# Patient Record
Sex: Male | Born: 1935
Health system: Southern US, Community
[De-identification: ages and names within clinical notes are randomized; demographics above are authoritative.]

## PROBLEM LIST (undated history)

## (undated) DIAGNOSIS — I4719 Other supraventricular tachycardia: Secondary | ICD-10-CM

## (undated) DIAGNOSIS — F05 Delirium due to known physiological condition: Secondary | ICD-10-CM

## (undated) DIAGNOSIS — I441 Atrioventricular block, second degree: Secondary | ICD-10-CM

## (undated) DIAGNOSIS — H3530 Unspecified macular degeneration: Secondary | ICD-10-CM

## (undated) DIAGNOSIS — R197 Diarrhea, unspecified: Secondary | ICD-10-CM

## (undated) DIAGNOSIS — K449 Diaphragmatic hernia without obstruction or gangrene: Secondary | ICD-10-CM

## (undated) DIAGNOSIS — I471 Supraventricular tachycardia: Secondary | ICD-10-CM

## (undated) DIAGNOSIS — Z950 Presence of cardiac pacemaker: Secondary | ICD-10-CM

## (undated) DIAGNOSIS — I452 Bifascicular block: Secondary | ICD-10-CM

## (undated) HISTORY — PX: COLON SURGERY: SHX602

## (undated) HISTORY — DX: Other supraventricular tachycardia: I47.19

## (undated) HISTORY — DX: Bifascicular block: I45.2

## (undated) HISTORY — DX: Atrioventricular block, second degree: I44.1

## (undated) HISTORY — DX: Presence of cardiac pacemaker: Z95.0

## (undated) HISTORY — PX: APPENDECTOMY: SHX54

## (undated) HISTORY — DX: Diaphragmatic hernia without obstruction or gangrene: K44.9

## (undated) HISTORY — DX: Diarrhea, unspecified: R19.7

## (undated) HISTORY — DX: Supraventricular tachycardia: I47.1

---

## 2002-02-08 ENCOUNTER — Other Ambulatory Visit: Admission: RE | Admit: 2002-02-08 | Discharge: 2002-02-08 | Payer: Self-pay | Admitting: Dermatology

## 2004-02-27 ENCOUNTER — Ambulatory Visit: Payer: Self-pay | Admitting: Family Medicine

## 2004-04-10 ENCOUNTER — Ambulatory Visit: Payer: Self-pay | Admitting: Family Medicine

## 2004-06-25 ENCOUNTER — Ambulatory Visit: Payer: Self-pay | Admitting: Family Medicine

## 2004-09-25 ENCOUNTER — Ambulatory Visit: Payer: Self-pay | Admitting: Family Medicine

## 2004-11-06 ENCOUNTER — Ambulatory Visit: Payer: Self-pay | Admitting: Family Medicine

## 2004-12-10 ENCOUNTER — Ambulatory Visit: Payer: Self-pay | Admitting: Family Medicine

## 2004-12-24 ENCOUNTER — Ambulatory Visit: Payer: Self-pay | Admitting: Family Medicine

## 2005-01-31 ENCOUNTER — Ambulatory Visit: Payer: Self-pay | Admitting: Family Medicine

## 2005-02-25 ENCOUNTER — Ambulatory Visit: Payer: Self-pay | Admitting: Family Medicine

## 2006-07-25 ENCOUNTER — Ambulatory Visit: Payer: Self-pay | Admitting: Family Medicine

## 2008-11-07 ENCOUNTER — Inpatient Hospital Stay (HOSPITAL_COMMUNITY): Admission: EM | Admit: 2008-11-07 | Discharge: 2008-11-08 | Payer: Self-pay | Admitting: Emergency Medicine

## 2008-11-07 ENCOUNTER — Ambulatory Visit: Payer: Self-pay | Admitting: Internal Medicine

## 2008-11-07 HISTORY — PX: PACEMAKER INSERTION: SHX728

## 2008-11-08 ENCOUNTER — Encounter: Payer: Self-pay | Admitting: Internal Medicine

## 2008-11-08 ENCOUNTER — Encounter (INDEPENDENT_AMBULATORY_CARE_PROVIDER_SITE_OTHER): Payer: Self-pay | Admitting: Cardiology

## 2008-11-10 ENCOUNTER — Encounter: Payer: Self-pay | Admitting: Internal Medicine

## 2008-11-23 ENCOUNTER — Ambulatory Visit: Payer: Self-pay

## 2008-11-23 ENCOUNTER — Encounter: Payer: Self-pay | Admitting: Internal Medicine

## 2009-02-16 DIAGNOSIS — H409 Unspecified glaucoma: Secondary | ICD-10-CM | POA: Insufficient documentation

## 2009-02-16 DIAGNOSIS — I4510 Unspecified right bundle-branch block: Secondary | ICD-10-CM

## 2009-02-17 ENCOUNTER — Ambulatory Visit: Payer: Self-pay | Admitting: Internal Medicine

## 2009-02-17 DIAGNOSIS — I4891 Unspecified atrial fibrillation: Secondary | ICD-10-CM

## 2009-02-17 DIAGNOSIS — I442 Atrioventricular block, complete: Secondary | ICD-10-CM

## 2009-09-12 ENCOUNTER — Encounter: Payer: Self-pay | Admitting: Internal Medicine

## 2009-09-13 ENCOUNTER — Ambulatory Visit: Payer: Self-pay | Admitting: Internal Medicine

## 2009-09-13 DIAGNOSIS — R0602 Shortness of breath: Secondary | ICD-10-CM

## 2009-10-03 ENCOUNTER — Telehealth (INDEPENDENT_AMBULATORY_CARE_PROVIDER_SITE_OTHER): Payer: Self-pay | Admitting: *Deleted

## 2009-10-04 ENCOUNTER — Telehealth (INDEPENDENT_AMBULATORY_CARE_PROVIDER_SITE_OTHER): Payer: Self-pay | Admitting: Radiology

## 2010-01-12 ENCOUNTER — Encounter (INDEPENDENT_AMBULATORY_CARE_PROVIDER_SITE_OTHER): Payer: Self-pay | Admitting: *Deleted

## 2010-02-02 ENCOUNTER — Ambulatory Visit: Payer: Self-pay | Admitting: Internal Medicine

## 2010-05-03 ENCOUNTER — Ambulatory Visit
Admission: RE | Admit: 2010-05-03 | Discharge: 2010-05-03 | Payer: Self-pay | Source: Home / Self Care | Attending: Internal Medicine | Admitting: Internal Medicine

## 2010-05-03 ENCOUNTER — Encounter: Payer: Self-pay | Admitting: Internal Medicine

## 2010-05-15 NOTE — Letter (Signed)
Summary: Device-Delinquent Check  [facility], Main Office  [address]   [place], [place] [address]   Phone: [phone]  Fax: [phone]     January 12, 2010 MRN: [id number]   [facility] [address] [place], [place]  [address]   Dear Mr. Haegele,  According to our records, you have not had your implanted device checked in the recommended period of time.  We are unable to determine appropriate device function without checking your device on a regular basis.  Please call our office to schedule an appointment , with Dr Johney Frame as soon as possible.  If you are having your device checked by another physician, please call us so that we may update our records.  Thank you,   [facility]

## 2010-05-15 NOTE — Progress Notes (Signed)
Summary: canclled exams  Phone Note Call from Patient   Caller: Daughter Summary of Call: Pt. decided he doesn't want any tests at this time. He has cancelled his echo and nuclear stress test. Initial call taken by: S.Akeen Ledyard

## 2010-05-15 NOTE — Assessment & Plan Note (Signed)
Summary: ROV/SOB/JSS   Visit Type:  Pacemaker check Primary Provider:  Dr. Lysbeth Galas  CC:  sob...edema/ankles...daughter said pt had 1 episode last week when pt was working in his garden had a fast heart rate...  History of Present Illness: Mr Pitta presents today for follow-up.  He reports progressive dypnsea with moderate activity recently.  He denies orthopnea, PND, or chest pain.  He reports recent BLE edema.  He was recently evaluated in Dr Joyce Copa office and initiated on lasix.  He is otherwise without complaint today.  Current Medications (verified): 1)  Multivitamins   Tabs (Multiple Vitamin) .... Once Daily 2)  Travatan Z 0.004 % Soln (Travoprost) .... Once Daily Left Eye 3)  Lasix 20 Mg Tabs (Furosemide) .Marland Kitchen.. 1 Tab Once Daily  Allergies (verified): No Known Drug Allergies  Past History:  Past Medical History:  1. Tachy palpitations for which has been treated with beta-blockers.   2. Hiatal hernia.   3. 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   4. Glaucoma.   5. Pacemaker implant for Mobitz II AV block  Past Surgical History: Reviewed history from 02/17/2009 and no changes required. appendectomy  pacemaker implantation 7/10  Social History: Reviewed history from 02/16/2009 and no changes required.  The patient lives in [place] alone.  He is widowed and   has 2 children.  He has a remote history of tobacco and denies alcohol   or drug use.   Review of Systems       All systems are reviewed and negative except as listed in the HPI.   Vital Signs:  Patient profile:   77 year old male Height:      69 inches Weight:      180 pounds BMI:     26.68 Pulse rate:   76 / minute Pulse rhythm:   irregular BP sitting:   114 / 70  (left arm) Cuff size:   large  Vitals Entered By: Danielle Rankin, CMA (September 13, 2009 4:30 PM)  Physical Exam  General:  elderly, looks much younger than stated age Head:  normocephalic and atraumatic Eyes:  PERRLA/EOM  intact; conjunctiva and lids normal. Mouth:  Teeth, gums and palate normal. Oral mucosa normal. Neck:  supple, JVP 8 Lungs:  few bibasilar rales, otherwise clear Heart:  RRR, no m/r/g Abdomen:  Bowel sounds positive; abdomen soft and non-tender without masses, organomegaly, or hernias noted. No hepatosplenomegaly. Msk:  Back normal, normal gait. Muscle strength and tone normal. Pulses:  pulses normal in all 4 extremities Extremities:  No clubbing or cyanosis. Neurologic:  Alert and oriented x 3. Skin:  Intact without lesions or rashes. Psych:  Normal affect.   EKG  Procedure date:  09/13/2009  Findings:      sinus 76 bpm, LBBB  PPM Specifications Following MD:  Hillis Range, MD     PPM Vendor:  St Jude     PPM Model Number:  [id number]     PPM Serial Number:  [id number] PPM DOI:  11/07/2008     PPM Implanting MD:  Hillis Range, MD  Lead 1    Location: RA     DOI: 11/07/2008     Model #: 1688TC     Serial #: [id number]     Status: active Lead 2    Location: RV     DOI: 11/07/2008     Model #: 1948     Serial #: [id number]     Status: active  Magnet Response Rate:  BOL 100 ERI 85  Indications:  Sick sinus syndrome   PPM Follow Up Pacer Dependent:  No      Episodes Coumadin:  No  Parameters Mode:  DDD     Lower Rate Limit:  60     Upper Rate Limit:  110 Paced AV Delay:  180     Sensed AV Delay:  160 MD Comments:  agree  Impression & Recommendations:  Problem # 1:  MOBITZ II ATRIOVENTRICULAR BLOCK (ICD-426.12) normal pacemaker function maximum heart rate with pacing increased to 130 bpm to reduce dyspnea  Problem # 2:  SHORTNESS OF BREATH (ICD-786.05) unclear etiology very mild volume overload on exam we will obtain an echo and lexiscan myoview to evaluate cause of dypnsea if low to moderate risk myoview, would treat medically given advanced age  Other Orders: Echocardiogram (Echo) Nuclear Stress Test (Nuc Stress Test)  Patient Instructions: 1)  Your physician  recommends that you schedule a follow-up appointment in: 4 weeks with Dr Johney Frame 2)  Your physician has requested that you have an echocardiogram.  Echocardiography is a painless test that uses sound waves to create images of your heart. It provides your doctor with information about the size and shape of your heart and how well your heart's chambers and valves are working.  This procedure takes approximately one hour. There are no restrictions for this procedure. 3)  Your physician has requested that you have an lexiscan myoview.  For further information please visit [email or web address]  Please follow instruction sheet, as given.

## 2010-05-15 NOTE — Progress Notes (Signed)
Summary: Nuclear Pre-Procedure  Phone Note Outgoing Call   Call placed by: Milana Na, EMT-P,  October 03, 2009 3:11 PM Summary of Call: Reviewed information on Myoview Information Sheet (see scanned document for further details).  Spoke with patient's daughter.     Nuclear Med Background Indications for Stress Test: Evaluation for Ischemia   History: Echo, Myocardial Perfusion Study, Pacemaker   Symptoms: DOE    Nuclear Pre-Procedure Cardiac Risk Factors: History of Smoking, LBBB Height (in): 69  Nuclear Med Study Referring MD:  J.Allred     Appended Document: Nuclear Pre-Procedure Missing information for the phone note. '05 MPS (+) Recommended a Cath but the patient declined. 07/10 ECHO EF 55-65% 07/10 PACEMAKER due to Symptomatic Bradycardia

## 2010-05-15 NOTE — Assessment & Plan Note (Signed)
Summary: pc2/st jude/lg   Visit Type:  Follow-up Primary Provider:  Dr. Lysbeth Galas   History of Present Illness: Austin Pittman presents today for follow-up.  He reports stable dypnsea with moderate activity recently.  He denies orthopnea, PND, or chest pain.  He reports recent BLE edema.  He was recently evaluated in Dr Joyce Copa office and initiated on lasix.  He has declined previously suggested echo and nuclear testing.  Current Medications (verified): 1)  Multivitamins   Tabs (Multiple Vitamin) .... Once Daily 2)  Travatan Z 0.004 % Soln (Travoprost) .... Once Daily Left Eye  Allergies (verified): No Known Drug Allergies  Past History:  Past Medical History:  1. Nonsustained atrial tachycardia  2. Hiatal hernia.   3. 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   4. Glaucoma.   5. Pacemaker implant for Mobitz II AV block  Past Surgical History: Reviewed history from 02/17/2009 and no changes required. appendectomy  pacemaker implantation 7/10  Social History: Reviewed history from 02/16/2009 and no changes required.  The patient lives in [place] alone.  He is widowed and   has 2 children.  He has a remote history of tobacco and denies alcohol   or drug use.   Review of Systems       All systems are reviewed and negative except as listed in the HPI.   Vital Signs:  Patient profile:   77 year old male Height:      69 inches Weight:      180 pounds BMI:     26.68 Pulse rate:   70 / minute BP sitting:   116 / 60  (left arm)  Vitals Entered By: Laurance Flatten CMA (February 02, 2010 9:12 AM)  Physical Exam  General:  elderly, looks much younger than stated age Head:  normocephalic and atraumatic Eyes:  PERRLA/EOM intact; conjunctiva and lids normal. Mouth:  Teeth, gums and palate normal. Oral mucosa normal. Neck:  supple, JVP 8 Chest Wall:  pacemaker site well healed Lungs:  CTAB Heart:  RRR, no m/r/g Abdomen:  Bowel sounds positive; abdomen soft and  non-tender without masses, organomegaly, or hernias noted. No hepatosplenomegaly. Msk:  Back normal, normal gait. Muscle strength and tone normal. Pulses:  pulses normal in all 4 extremities Extremities:  No clubbing or cyanosis. Neurologic:  Alert and oriented x 3.   PPM Specifications Following MD:  Hillis Range, MD     PPM Vendor:  St Jude     PPM Model Number:  [id number]     PPM Serial Number:  [id number] PPM DOI:  11/07/2008     PPM Implanting MD:  Hillis Range, MD  Lead 1    Location: RA     DOI: 11/07/2008     Model #: 1688TC     Serial #: [id number]     Status: active Lead 2    Location: RV     DOI: 11/07/2008     Model #: 1948     Serial #: [id number]     Status: active  Magnet Response Rate:  BOL 100 ERI 85  Indications:  Sick sinus syndrome   PPM Follow Up Remote Check?  No Battery Voltage:  2.96 V     Battery Est. Longevity:  8.8 years     Pacer Dependent:  No       PPM Device Measurements Atrium  Amplitude: 3.3 mV, Impedance: 400 ohms, Threshold: 0.875 V at 0.4 msec Right Ventricle  Amplitude: 10.6 mV, Impedance: 710 ohms,  Threshold: 0.5 V at 0.4 msec  Episodes MS Episodes:  26     Percent Mode Switch:  <1%     Coumadin:  No  Parameters Mode:  DDD     Lower Rate Limit:  60     Upper Rate Limit:  110 Paced AV Delay:  180     Sensed AV Delay:  160 Next Remote Date:  05/03/2010     Next Cardiology Appt Due:  01/14/2011 Tech Comments:  No parameter changes.  Device function normal.  Longest mode switch 24 seconds, - coumadin.  Merlin transmissions every 3 months.  ROV 1 year with Dr. Johney Frame. Altha Harm, LPN  February 02, 2010 9:20 AM  MD Comments:  agree  Impression & Recommendations:  Problem # 1:  MOBITZ II ATRIOVENTRICULAR BLOCK (ICD-426.12) normal pacemaker function no changes  Problem # 2:  SHORTNESS OF BREATH (ICD-786.05) stable salt restriction  The following medications were removed from the medication list:    Lasix 20 Mg Tabs (Furosemide) .Marland Kitchen... 1 tab once  daily  Patient Instructions: 1)  Your physician recommends that you schedule a follow-up appointment in: 12 months. 2)  Your physician recommends that you continue on your current medications as directed. Please refer to the Current Medication list given to you today.

## 2010-05-27 ENCOUNTER — Encounter (INDEPENDENT_AMBULATORY_CARE_PROVIDER_SITE_OTHER): Payer: Self-pay | Admitting: *Deleted

## 2010-06-06 NOTE — Letter (Signed)
Summary: Remote Device Check  [facility], Main Office  [address]   [place], [place] [address]   Phone: [phone]  Fax: [phone]     May 27, 2010 MRN: [id number]   [facility] [address] [place], [place]  [address]   Dear Mr. Maultsby,   Your remote transmission was recieved and reviewed by your physician.  All diagnostics were within normal limits for you.  __X___Your next transmission is scheduled for: 08-02-2010.  Please transmit at any time this day.  If you have a wireless device your transmission will be sent automatically.   Sincerely,  Vella Kohler

## 2010-06-06 NOTE — Cardiovascular Report (Signed)
Summary: Office Visit Remote   Office Visit Remote   Imported By: Roderic Ovens 05/29/2010 14:48:43  _____________________________________________________________________  External Attachment:    Type:   Image     Comment:   External Document

## 2010-07-22 LAB — POCT I-STAT, CHEM 8
BUN: 31 mg/dL — ABNORMAL HIGH (ref 6–23)
Calcium, Ion: 1.15 mmol/L (ref 1.12–1.32)
HCT: 41 % (ref 39.0–52.0)
Hemoglobin: 13.9 g/dL (ref 13.0–17.0)
Sodium: 140 mEq/L (ref 135–145)
TCO2: 24 mmol/L (ref 0–100)

## 2010-07-22 LAB — COMPREHENSIVE METABOLIC PANEL
ALT: 16 U/L (ref 0–53)
Albumin: 3.5 g/dL (ref 3.5–5.2)
Alkaline Phosphatase: 48 U/L (ref 39–117)
Calcium: 8.7 mg/dL (ref 8.4–10.5)
Potassium: 3.8 mEq/L (ref 3.5–5.1)
Sodium: 141 mEq/L (ref 135–145)
Total Protein: 6.3 g/dL (ref 6.0–8.3)

## 2010-07-22 LAB — POCT CARDIAC MARKERS
Myoglobin, poc: 148 ng/mL (ref 12–200)
Troponin i, poc: <0.05 ng/mL (ref 0.00–0.09)

## 2010-07-22 LAB — PROTIME-INR
INR: 1 (ref 0.00–1.49)
Prothrombin Time: 13.3 seconds (ref 11.6–15.2)

## 2010-07-22 LAB — CBC
Hemoglobin: 14.1 g/dL (ref 13.0–17.0)
RBC: 4.35 MIL/uL (ref 4.22–5.81)
WBC: 7.9 10*3/uL (ref 4.0–10.5)

## 2010-07-22 LAB — TSH: TSH: 3.832 u[IU]/mL (ref 0.350–4.500)

## 2010-07-22 LAB — DIFFERENTIAL
Basophils Relative: 0 % (ref 0–1)
Eosinophils Absolute: 0.1 10*3/uL (ref 0.0–0.7)
Lymphs Abs: 1 10*3/uL (ref 0.7–4.0)
Monocytes Absolute: 0.5 10*3/uL (ref 0.1–1.0)
Monocytes Relative: 7 % (ref 3–12)
Neutro Abs: 6.2 10*3/uL (ref 1.7–7.7)

## 2010-07-22 LAB — CK TOTAL AND CKMB (NOT AT ARMC)
CK, MB: 2.3 ng/mL (ref 0.3–4.0)
Relative Index: 2.3 (ref 0.0–2.5)
Total CK: 100 U/L (ref 7–232)

## 2010-07-22 LAB — TROPONIN I: Troponin I: 0.01 ng/mL (ref 0.00–0.06)

## 2010-08-02 ENCOUNTER — Ambulatory Visit (INDEPENDENT_AMBULATORY_CARE_PROVIDER_SITE_OTHER): Payer: No Typology Code available for payment source | Admitting: *Deleted

## 2010-08-02 DIAGNOSIS — I4891 Unspecified atrial fibrillation: Secondary | ICD-10-CM

## 2010-08-02 DIAGNOSIS — I441 Atrioventricular block, second degree: Secondary | ICD-10-CM

## 2010-08-03 ENCOUNTER — Other Ambulatory Visit: Payer: Self-pay | Admitting: Internal Medicine

## 2010-08-12 ENCOUNTER — Encounter: Payer: Self-pay | Admitting: *Deleted

## 2010-08-12 NOTE — Progress Notes (Signed)
Pacer remote check  

## 2010-08-28 NOTE — Discharge Summary (Signed)
Austin Pittman, INGRAHAM NO.:  [id number]   MEDICAL RECORD NO.:  [id number]          PATIENT TYPE:  INP   LOCATION:  2501                         FACILITY:  [facility]   PHYSICIAN:  Nicki Guadalajara, M.D.     DATE OF BIRTH:  July 16, 1915   DATE OF ADMISSION:  11/07/2008  DATE OF DISCHARGE:  11/08/2008                               DISCHARGE SUMMARY   DISCHARGE DIAGNOSES:  1. Symptomatic bradycardia, St. Jude pacemaker implant this admission      by Dr. Johney Frame.  2. Presumed coronary disease with remote abnormal stress test, the      patient declined catheterization, and he has been treated medically      since.  3. 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   HOSPITAL COURSE:  The patient is a 77 year old male, followed by Dr.  Tresa Endo and Dr. Lysbeth Galas.  He has been on Bystolic 2.5 mg a day as an  outpatient.  He had a remote stress test in 2005 that was abnormal and  cath was recommended, but the patient declined.  The patient presented  with weakness on November 07, 2008, and was found to have symptomatic  bradycardia with intermittent third-degree AV block.  The patient was  seen in consult by Dr. Johney Frame.  He underwent elective pacemaker implant  without complications on November 07, 2008.  Postoperatively, he is doing  well and we feel he could be discharged.  He will follow up with Dr.  Johney Frame for wound check as directed and then see Dr. Tresa Endo in [place].   DISCHARGE MEDICATION:  Ophthalmic drops for glaucoma.   LABORATORY DATA:  Troponins were negative x2.  INR 1.0.  TSH 3.82.  CK-  MB and troponin are negative x1.  Sodium 141, potassium 3.8, BUN 28,  creatinine 1.3.  Liver functions were normal.  White count 7.9,  hemoglobin 14.1, hematocrit 41, platelets 103.   Chest x-ray shows pacemaker placement with no pneumothorax.   DISPOSITION:  The patient is discharged in stable condition with  followup as noted above.  The patient is instructed to keep the site  dry  for 7 days.      Abelino Derrick, P.A.    ______________________________  Nicki Guadalajara, M.D.    Lenard Lance  D:  11/08/2008  T:  11/08/2008  Job:  [id number]   cc:   Delaney Meigs, M.D.  Hillis Range, MD

## 2010-08-28 NOTE — Consult Note (Signed)
Austin Pittman, Austin Pittman NO.:  [id number]   MEDICAL RECORD NO.:  [id number]          PATIENT TYPE:  INP   LOCATION:  2501                         FACILITY:  [facility]   PHYSICIAN:  Hillis Range, MD       DATE OF BIRTH:  03/05/1916   DATE OF CONSULTATION:  11/07/2008  DATE OF DISCHARGE:                                 CONSULTATION   REQUESTING PHYSICIAN:  Sheliah Mends, MD   PRIMARY CARE PHYSICIAN:  Delaney Meigs, MD   REASON FOR CONSULTATION:  Tachycardia-bradycardia syndrome with  symptoms.   HISTORY OF PRESENT ILLNESS:  Austin Pittman is a very pleasant 77 year old  gentleman without significant past medical problems except for  palpitations for which, he has been treated previously with beta-  blockers, who presents with symptoms of dizziness and fatigue.  The  patient reports being in his usual state of health though he has had  some progressive symptoms of shortness of breath over the past month.  Nevertheless, he remains very active and continues to do his own  gardening.  He notes that over the past 2 weeks, he has had progressive  symptoms of fatigue and decreased exercise tolerance.  He reports  multiple episodes of dizziness with presyncope.  He denies Austin  syncope.  He subsequently was checking his heart rate at home and  documented it to be in the 30s.  He therefore presented to the emergency  department where he was found to have Mobitz II second-degree AV block  with prolonged RR intervals and occasional complete heart block.  He  previously has a longstanding history of a right bundle branch block in  left anterior fascicular block pattern.  He has also had multiple  episodes of palpitations for which, he was initially placed on atenolol.  Due to bradycardias, atenolol was discontinued, and he was tried on  Bystolic at a minimal dose.  He continues to have occasional  palpitations with this.  He reports intermittent compliance with  Bystolic recently.   He now presents for further evaluation and  management of his bradyarrhythmias.  He is otherwise without complaint  today.  He denies chest pain, orthopnea, PND, or syncope.  He has not  had a recent fevers, chills, or symptoms of infection.   PAST MEDICAL HISTORY:  1. Tachy palpitations for which has been treated with beta-blockers.  2. Hiatal hernia.  3. 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  4. Glaucoma.   MEDICATIONS:  Bystolic 2.5 mg daily.   ALLERGIES:  No known drug allergies.   SOCIAL HISTORY:  The patient lives in [place] alone.  He is widowed and  has 2 children.  He has a remote history of tobacco and denies alcohol  or drug use.   FAMILY HISTORY:  He is unaware of any significant arrhythmias.   REVIEW OF SYSTEMS:  All systems were reviewed and negative except as  outlined in the HPI above.   PHYSICAL EXAMINATION:  VITAL SIGNS:  Blood pressure 139/63, heart rate  56, respirations 16, sats 98% on room air,  afebrile.  GENERAL:  The  patient is a well-appearing elderly gentleman, who appears much younger  than his stated age.  He is alert and oriented x3.  HEENT:  Normocephalic, atraumatic.  Sclerae clear.  Conjunctivae pink.  Oropharynx clear.  NECK:  Supple.  No thyromegaly, JVD, or bruits.  LUNGS:  Clear to auscultation bilaterally.  HEART:  Regular rate and rhythm, bradycardic rhythm.  No murmurs, rubs,  or gallops.  GI:  Soft, nontender, nondistended.  Positive bowel sounds.  EXTREMITIES:  No clubbing, cyanosis, or edema.  NEUROLOGIC:  Strength and sensation are intact.  SKIN:  No ecchymoses or lacerations.  MUSCULOSKELETAL:  No deformity or atrophy.  PSYCHIATRIC:  Euthymic mood.  Full affect.   EKG reveals sinus bradycardia with a first-degree AV block and a PR  duration of 265 milliseconds.  There is a right bundle branch, left  anterior fascicular block pattern.  There is a second EKG from today  also, which reveals Mobitz II  second-degree AV block with 2:1 AV block.  Additional telemetry strips reveal intermittent complete heart block.   LABORATORY DATA:  Sodium 141, potassium 3.8, BUN 28, creatinine 1.35.  Cardiac markers negative thus far.  White blood cell count 7.9,  hematocrit 41, platelets 103.   Chest x-ray:  I reviewed the patient's chest x-ray today, which reveals  no acute airspace disease.   IMPRESSION:  Austin Pittman is a very pleasant 77 year old gentleman with a  longstanding history of trifascicular block, who is now admitted with  symptomatic Mobitz II second-degree atrioventricular block and  intermittent third-degree atrioventricular block.  He has had symptoms  of palpitations for which, he requires beta-blockers long term.  He  therefore has tachycardia-bradycardia syndrome.  There is no clearly  reversible cause for the patient's atrioventricular block.  I suspect  that it is likely related to his advanced age.  I therefore would  recommend pacemaker implantation at this time.   PLAN:  Risks, benefits, and alternatives to dual-chamber pacemaker  implantation were discussed at length with the patient today.  He  understands that the risk include but are not limited to infection,  bleeding, pneumothorax, vascular damage, pericardial effusion,  tamponade, perforation, lead dislodgement requiring revision, renal  failure, MI, stroke, and death.  He accepts these risks and wishes to  proceed.  We will therefore plan for pacemaker implantation at the next  available time.      Hillis Range, MD  Electronically Signed     JA/MEDQ  D:  11/07/2008  T:  11/08/2008  Job:  [id number]   cc:   Sheliah Mends, MD  Delaney Meigs, M.D.  Nicki Guadalajara, M.D.

## 2010-08-28 NOTE — Op Note (Signed)
NAMEKAHIL, AGNER NO.:  [id number]   MEDICAL RECORD NO.:  [id number]          PATIENT TYPE:  INP   LOCATION:  2501                         FACILITY:  [facility]   PHYSICIAN:  Hillis Range, MD       DATE OF BIRTH:  December 22, 1915   DATE OF PROCEDURE:  DATE OF DISCHARGE:                               OPERATIVE REPORT   SURGEON:  Hillis Range, MD   PREPROCEDURE DIAGNOSES:  1. Mobitz II second-degree atrioventricular block.  2. Tachycardia-bradycardia syndrome.   POSTPROCEDURE DIAGNOSES:  1. Mobitz II second-degree atrioventricular block.  2. Tachycardia-bradycardia syndrome.   PROCEDURE:  Dual-chamber pacemaker implantation.   INTRODUCTION:  Mr. Harshberger is a very pleasant 77 year old gentleman without  significant cardiac history who presents with Mobitz II second-degree AV  block as well as intermittent complete heart block.  He has a  longstanding right bundle-branch block and left anterior fascicular  block.  He reports symptoms of fatigue, decreased exercise tolerance,  and dizziness over the past few days.  Upon presentation to [facility]  Emergency Department, he was found to have Mobitz II second-degree AV  block.  He has previously had palpitations which required chronic  therapy with beta-blockers.  His beta-blockers have been minimized;  however, he continues to have these symptoms.  He therefore presents for  dual-chamber pacemaker implantation.   DESCRIPTION OF PROCEDURE:  Informed written consent was obtained and the  patient was brought to the electrophysiology lab in a fasting state.  He  was adequately sedated with intravenous Valium as outlined in the  nursing report.  The patient's left chest was prepped and draped in the  usual sterile fashion by the EP lab staff.  The skin overlying the left  deltopectoral region was infiltrated with lidocaine for local analgesia.  A 5-cm incision was made over the left deltopectoral region.  A  subcutaneous  pacemaker pocket was fashioned using a combination of sharp  and blunt dissection.  Electrocautery was used to assure hemostasis.  The left cephalic vein was visualized and cannulated.  Through this  vein, a St. Jude Medical Tendril SDX model [id number] (serial number [id number]) right atrial lead and a St. Jude Medical Isoflex model [phone]  (serial number [id number]) right ventricular lead were advanced with  fluoroscopic visualization into the right atrial appendage and right  ventricular apex positions respectively.  Initial lead measurements  revealed an atrial lead P-wave of 2.millivolts with an impedance of 393  ohms and a threshold of 1.6 volts at 0.5 milliseconds.  The right  ventricular lead R-wave measured 10.3 millivolts with an impedance of  708 ohms and a threshold of 0.4 volts at 0.5 milliseconds.  The leads  were then secured to the pectoralis fascia using #2 silk suture over the  suture sleeves.  The leads were then connected to a St. Jude Medical  Accent RFDR model [phone] (serial number [id number]) dual-chamber pacemaker.  The pacemaker pocket was irrigated with copious gentamicin solution.  The pacemaker was then placed into the pocket.  The pocket was then  closed  in 2 layers with 2.0 Vicryl suture for the subcutaneous and  subcuticular layers.  Steri-Strips and a sterile dressing were then  applied.  There were no early apparent complications.   CONCLUSIONS:  1. Mobitz II second-degree AV block with symptomatic bradycardia upon      presentation.  2. Successful dual-chamber pacemaker implantation as described above.  3. No early apparent complications.      Hillis Range, MD  Electronically Signed     JA/MEDQ  D:  11/07/2008  T:  11/08/2008  Job:  [id number]   cc:   Nicki Guadalajara, M.D.  Delaney Meigs, M.D.  Sheliah Mends, MD

## 2010-11-01 ENCOUNTER — Ambulatory Visit (INDEPENDENT_AMBULATORY_CARE_PROVIDER_SITE_OTHER): Payer: Medicare Other | Admitting: *Deleted

## 2010-11-01 ENCOUNTER — Other Ambulatory Visit: Payer: Self-pay

## 2010-11-01 DIAGNOSIS — I495 Sick sinus syndrome: Secondary | ICD-10-CM

## 2010-11-01 DIAGNOSIS — I441 Atrioventricular block, second degree: Secondary | ICD-10-CM

## 2010-11-01 DIAGNOSIS — I4891 Unspecified atrial fibrillation: Secondary | ICD-10-CM

## 2010-11-01 LAB — REMOTE PACEMAKER DEVICE
AL THRESHOLD: 0.875 V
BAMS-0003: 70 {beats}/min
DEVICE MODEL PM: 2328745

## 2010-11-07 NOTE — Progress Notes (Signed)
Pacer remote check  

## 2010-11-09 ENCOUNTER — Encounter: Payer: Self-pay | Admitting: *Deleted

## 2011-01-22 ENCOUNTER — Encounter: Payer: Self-pay | Admitting: *Deleted

## 2011-01-22 ENCOUNTER — Encounter: Payer: Self-pay | Admitting: Internal Medicine

## 2011-02-08 ENCOUNTER — Encounter: Payer: Self-pay | Admitting: Internal Medicine

## 2011-02-08 ENCOUNTER — Ambulatory Visit (INDEPENDENT_AMBULATORY_CARE_PROVIDER_SITE_OTHER): Payer: Medicare Other | Admitting: Internal Medicine

## 2011-02-08 DIAGNOSIS — I441 Atrioventricular block, second degree: Secondary | ICD-10-CM

## 2011-02-08 DIAGNOSIS — I4891 Unspecified atrial fibrillation: Secondary | ICD-10-CM

## 2011-02-08 LAB — PACEMAKER DEVICE OBSERVATION
AL IMPEDENCE PM: 362.5 Ohm
AL THRESHOLD: 0.875 V
ATRIAL PACING PM: 22
BAMS-0003: 70 {beats}/min
DEVICE MODEL PM: 2328745
RV LEAD AMPLITUDE: 9.4 mv
RV LEAD THRESHOLD: 0.5 V

## 2011-02-08 NOTE — Assessment & Plan Note (Signed)
Normal pacemaker function See Pace Art report No changes today  

## 2011-02-08 NOTE — Progress Notes (Signed)
The patient presents today for routine electrophysiology followup.  Since last being seen in our clinic, the patient reports doing very well.  Today, he denies symptoms of palpitations, chest pain,  orthopnea, PND, lower extremity edema, dizziness, presyncope, syncope, or neurologic sequela.  His dypsnea is stable.  The patient feels that he is tolerating medications without difficulties and is otherwise without complaint today.   Past Medical History  Diagnosis Date  . Atrial tachycardia     nonsustained  . Hiatal hernia   . 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 Glaucoma   . Second degree Mobitz II AV block     s/p PPM    Past Surgical History  Procedure Date  . Appendectomy   . Pacemaker insertion 11/07/08    St Jude implanted by Dr Johney Frame    Current Outpatient Prescriptions  Medication Sig Dispense Refill  . beta carotene w/minerals (OCUVITE) tablet Take 1 tablet by mouth daily.        . travoprost, benzalkonium, (TRAVATAN) 0.004 % ophthalmic solution Place 1 drop into both eyes at bedtime.         No Known Allergies  History   Social History  . Marital Status: Widowed    Spouse Name: N/A    Number of Children: 2  . Years of Education: N/A   Occupational History  . Not on file.   Social History Main Topics  . Smoking status: Former Smoker -- 1.0 packs/day for 20 years    Types: Cigarettes    Quit date: 04/15/1946  . Smokeless tobacco: Former Neurosurgeon    Types: Chew    Quit date: 04/15/1948   Comment: chewed tobacco for 2 years  . Alcohol Use: No  . Drug Use: No  . Sexually Active: Not on file   Other Topics Concern  . Not on file   Social History Narrative  . No narrative on file    Physical Exam: Filed Vitals:   02/08/11 1341  BP: 123/74  Pulse: 69  Height: 5\' 9"  (1.753 m)  Weight: 174 lb (78.926 kg)    GEN- The patient is well appearing, alert and oriented x 3 today.   Head- normocephalic, atraumatic Eyes-  Sclera  clear, conjunctiva pink Ears- hearing intact Oropharynx- clear Neck- supple, no JVP Lymph- no cervical lymphadenopathy Lungs- Clear to ausculation bilaterally, normal work of breathing Chest- pacemaker pocket is well healed Heart- Regular rate and rhythm, no murmurs, rubs or gallops, PMI not laterally displaced GI- soft, NT, ND, + BS Extremities- no clubbing, cyanosis, or edema  Pacemaker interrogation- reviewed in detail today,  See PACEART report  Assessment and Plan:

## 2011-05-09 ENCOUNTER — Encounter: Payer: Medicare Other | Admitting: *Deleted

## 2011-05-13 ENCOUNTER — Encounter: Payer: Self-pay | Admitting: *Deleted

## 2011-09-08 ENCOUNTER — Inpatient Hospital Stay (HOSPITAL_BASED_OUTPATIENT_CLINIC_OR_DEPARTMENT_OTHER)
Admission: EM | Admit: 2011-09-08 | Discharge: 2011-09-14 | DRG: 389 | Disposition: A | Discharge status: Home / Self Care | Payer: Medicare Other | Attending: General Surgery | Admitting: General Surgery

## 2011-09-08 ENCOUNTER — Emergency Department (HOSPITAL_BASED_OUTPATIENT_CLINIC_OR_DEPARTMENT_OTHER): Payer: Medicare Other

## 2011-09-08 ENCOUNTER — Encounter (HOSPITAL_BASED_OUTPATIENT_CLINIC_OR_DEPARTMENT_OTHER): Payer: Self-pay | Admitting: *Deleted

## 2011-09-08 DIAGNOSIS — D72829 Elevated white blood cell count, unspecified: Secondary | ICD-10-CM

## 2011-09-08 DIAGNOSIS — H3530 Unspecified macular degeneration: Secondary | ICD-10-CM | POA: Diagnosis present

## 2011-09-08 DIAGNOSIS — K5669 Other intestinal obstruction: Principal | ICD-10-CM | POA: Diagnosis present

## 2011-09-08 DIAGNOSIS — K4090 Unilateral inguinal hernia, without obstruction or gangrene, not specified as recurrent: Secondary | ICD-10-CM | POA: Diagnosis present

## 2011-09-08 DIAGNOSIS — Z950 Presence of cardiac pacemaker: Secondary | ICD-10-CM

## 2011-09-08 DIAGNOSIS — K449 Diaphragmatic hernia without obstruction or gangrene: Secondary | ICD-10-CM | POA: Diagnosis present

## 2011-09-08 DIAGNOSIS — R197 Diarrhea, unspecified: Secondary | ICD-10-CM | POA: Diagnosis not present

## 2011-09-08 DIAGNOSIS — F05 Delirium due to known physiological condition: Secondary | ICD-10-CM | POA: Diagnosis not present

## 2011-09-08 DIAGNOSIS — K56609 Unspecified intestinal obstruction, unspecified as to partial versus complete obstruction: Secondary | ICD-10-CM

## 2011-09-08 DIAGNOSIS — K8020 Calculus of gallbladder without cholecystitis without obstruction: Secondary | ICD-10-CM | POA: Diagnosis present

## 2011-09-08 HISTORY — DX: Unspecified macular degeneration: H35.30

## 2011-09-08 HISTORY — DX: Delirium due to known physiological condition: F05

## 2011-09-08 LAB — URINALYSIS, ROUTINE W REFLEX MICROSCOPIC
Glucose, UA: NEGATIVE mg/dL
Hgb urine dipstick: NEGATIVE
Ketones, ur: 15 mg/dL — AB
Leukocytes, UA: NEGATIVE
Nitrite: NEGATIVE
Protein, ur: NEGATIVE mg/dL
Specific Gravity, Urine: 1.022 (ref 1.005–1.030)
Urobilinogen, UA: 0.2 mg/dL (ref 0.0–1.0)
pH: 5 (ref 5.0–8.0)

## 2011-09-08 LAB — COMPREHENSIVE METABOLIC PANEL
ALT: 13 U/L (ref 0–53)
Albumin: 4.2 g/dL (ref 3.5–5.2)
Alkaline Phosphatase: 83 U/L (ref 39–117)
BUN: 25 mg/dL — ABNORMAL HIGH (ref 6–23)
Calcium: 10.1 mg/dL (ref 8.4–10.5)
Potassium: 4.3 mEq/L (ref 3.5–5.1)
Sodium: 138 mEq/L (ref 135–145)
Total Protein: 8 g/dL (ref 6.0–8.3)

## 2011-09-08 LAB — CBC
MCH: 31.8 pg (ref 26.0–34.0)
MCHC: 35.2 g/dL (ref 30.0–36.0)
RDW: 13.5 % (ref 11.5–15.5)

## 2011-09-08 LAB — LIPASE, BLOOD: Lipase: 17 U/L (ref 11–59)

## 2011-09-08 MED ORDER — IOHEXOL 300 MG/ML  SOLN
40.0000 mL | Freq: Once | INTRAMUSCULAR | Status: AC | PRN
  Administered 2011-09-08: 40 mL via ORAL

## 2011-09-08 MED ORDER — ONDANSETRON HCL 4 MG/2ML IJ SOLN
4.0000 mg | Freq: Once | INTRAMUSCULAR | Status: AC
  Administered 2011-09-08: 4 mg via INTRAVENOUS
  Filled 2011-09-08: qty 2

## 2011-09-08 MED ORDER — HYDROMORPHONE HCL PF 1 MG/ML IJ SOLN
1.0000 mg | Freq: Once | INTRAMUSCULAR | Status: AC
  Administered 2011-09-08: 1 mg via INTRAVENOUS
  Filled 2011-09-08: qty 1

## 2011-09-08 MED ORDER — SODIUM CHLORIDE 0.9 % IV BOLUS (SEPSIS)
500.0000 mL | Freq: Once | INTRAVENOUS | Status: AC
  Administered 2011-09-08: 500 mL via INTRAVENOUS

## 2011-09-08 MED ORDER — IOHEXOL 300 MG/ML  SOLN
100.0000 mL | Freq: Once | INTRAMUSCULAR | Status: AC | PRN
  Administered 2011-09-08: 100 mL via INTRAVENOUS

## 2011-09-08 NOTE — ED Notes (Signed)
Pt states he has felt bad for the past day or two. C/O abd pain and head "Not feeling right" Gagging and burping a lot. Pain comes and goes.

## 2011-09-08 NOTE — ED Provider Notes (Signed)
History   This chart was scribed for Ethelda Chick, MD by Charolett Bumpers . The patient was seen in room MH02/MH02.    CSN: [id number]  Arrival date & time 09/08/11  1918   First MD Initiated Contact with Patient 09/08/11 2044      Chief Complaint  Patient presents with  . Abdominal Pain    (Consider location/radiation/quality/duration/timing/severity/associated sxs/prior treatment) HPI Austin Pittman is a 78 y.o. male who presents to the Emergency Department complaining of intermittent, moderate periumbilcal abdominal pain with associated nausea and mild distension for the past 1-2 days. Family also reports increased burping and states that the patient has been gagging. Patient denies any v/d. Last normal BM this was this morning. Family denies any h/o diverticulitis or other abdominal problems. Patient reports a h/o an appendectomy when he was 88. Patient states that he felt normal prior to symptom onset. Nothing makes the symptoms better or worse.   Past Medical History  Diagnosis Date  . Atrial tachycardia     nonsustained  . Hiatal hernia   . 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 Glaucoma   . Second degree Mobitz II AV block     s/p PPM   . Macular degeneration     Past Surgical History  Procedure Date  . Appendectomy   . Pacemaker insertion 11/07/08    St Jude implanted by Dr Johney Frame    History reviewed. No pertinent family history.  History  Substance Use Topics  . Smoking status: Former Smoker -- 1.0 packs/day for 20 years    Types: Cigarettes    Quit date: 04/15/1946  . Smokeless tobacco: Former Neurosurgeon    Types: Chew    Quit date: 04/15/1948   Comment: chewed tobacco for 2 years  . Alcohol Use: No      Review of Systems  Constitutional: Negative for fever.  Respiratory: Negative for shortness of breath.   Cardiovascular: Negative for chest pain.  Gastrointestinal: Positive for nausea, abdominal pain and abdominal  distention. Negative for vomiting, diarrhea and blood in stool.  All other systems reviewed and are negative.    Allergies  Review of patient's allergies indicates no known allergies.  Home Medications   No current outpatient prescriptions on file.  BP 134/65  Pulse 65  Temp(Src) 98.2 F (36.8 C) (Oral)  Resp 18  Ht 5\' 9"  (1.753 m)  Wt 171 lb 9.6 oz (77.837 kg)  BMI 25.34 kg/m2  SpO2 91%  Physical Exam  Nursing note and vitals reviewed. Constitutional: He is oriented to person, place, and time. He appears well-developed and well-nourished. No distress.       Uncomfortable appearance.   HENT:  Head: Normocephalic and atraumatic.  Eyes: EOM are normal. Pupils are equal, round, and reactive to light.  Neck: Normal range of motion. Neck supple. No tracheal deviation present.  Cardiovascular: Normal rate, regular rhythm and normal heart sounds.   Pulmonary/Chest: Effort normal and breath sounds normal. No respiratory distress.  Abdominal: Soft. He exhibits distension (mildly). There is tenderness.       Periumbilical tenderness.   Musculoskeletal: Normal range of motion. He exhibits no edema.  Neurological: He is alert and oriented to person, place, and time. No sensory deficit.  Skin: Skin is warm and dry.  Psychiatric: He has a normal mood and affect. His behavior is normal.    ED Course  Procedures (including critical care time)  DIAGNOSTIC STUDIES: Oxygen Saturation is  100% on room air, normal by my interpretation.    COORDINATION OF CARE:  2109: Discussed planned course of treatment with the patient, who is agreeable at this time. Will order a CT scan of abdomen.  2115: Medication Orders: Ondansetron (Zofran) injection 4 mg-once; Hydromorphone (Dilaudid) injection 1 mg-once; Sodium chloride 0.9% bolus 500 mL-once.   11:47 PM CT scan shows SBO- surgery paged  12:34 AM have paged surgery x 3 without response.  Secretary has talked to carelink multiple times.  I have  updated patient and family about the plan. Pt has no further pain and is no longer vomiting.   12:54 AM d/w Dr. Biagio Quint, he request patient to be transferred to [facility] ED.  Also requests NG tube to be placed.     Date: 09/08/2011  Rate: 93  Rhythm: AV paced rhythm  QRS Axis: left axis deviation  Intervals: normal  ST/T Wave abnormalities: normal  Conduction Disutrbances:paced  Narrative Interpretation:   Old EKG Reviewed: rate faster compared to prior ekg of 11/07/08      Labs Reviewed  URINALYSIS, ROUTINE W REFLEX MICROSCOPIC - Abnormal; Notable for the following:    APPearance CLOUDY (*)    Bilirubin Urine SMALL (*)    Ketones, ur 15 (*)    All other components within normal limits  CBC - Abnormal; Notable for the following:    WBC 13.6 (*)    Hemoglobin 17.2 (*)    Platelets 148 (*)    All other components within normal limits  COMPREHENSIVE METABOLIC PANEL - Abnormal; Notable for the following:    Glucose, Bld 156 (*)    BUN 25 (*)    GFR calc non Af Amer 50 (*)    GFR calc Af Amer 58 (*)    All other components within normal limits  BASIC METABOLIC PANEL - Abnormal; Notable for the following:    BUN 26 (*)    Calcium 8.1 (*)    GFR calc non Af Amer 50 (*)    GFR calc Af Amer 58 (*)    All other components within normal limits  CBC - Abnormal; Notable for the following:    Platelets 128 (*)    All other components within normal limits  CBC - Abnormal; Notable for the following:    Platelets 125 (*)    All other components within normal limits  OCCULT BLOOD GASTRIC / DUODENUM - Abnormal; Notable for the following:    Occult Blood, Gastric POSITIVE (*)    All other components within normal limits  CBC - Abnormal; Notable for the following:    Platelets 120 (*) REPEATED TO VERIFY   All other components within normal limits  BASIC METABOLIC PANEL - Abnormal; Notable for the following:    Calcium 8.1 (*)    GFR calc non Af Amer 60 (*)    GFR calc Af Amer 70  (*)    All other components within normal limits  LIPASE, BLOOD  CLOSTRIDIUM DIFFICILE BY PCR  MAGNESIUM  STOOL CULTURE   Dg Abd 2 Views  09/10/2011  *RADIOLOGY REPORT*  Clinical Data: 78 year old male with abdominal pain, small bowel obstruction, diarrhea.  ABDOMEN - 2 VIEW  Comparison: CT abdomen pelvis 09/08/2011.  Findings: Cholelithiasis.  Enteric tube in place terminating in the proximal stomach.  No pneumoperitoneum.  A small right pleural effusion is evident.Interval mildly improved bowel gas pattern, residual gas filled small bowel loops now measure up to 25 mm diameter, previously 30 mm.  There is gas in the colon to the level of the rectum. Stable visualized osseous structures.  IMPRESSION: 1.  Decreased small bowel obstruction, partial obstruction now suspected. 2.  No pneumoperitoneum. 3.  Right pleural effusion.  Cholelithiasis.  Original Report Authenticated By: Ulla Potash III, M.D.     1. Small bowel obstruction   2. Leukocytosis       MDM  Pt presents with c/o abdominal pain and nausea/vomiting.  Symptoms started earlier today.  CT scan shows SBO with transition point in RLQ.  Pt wtihout further pain or vomiting after one dose of pain and nausea meds.  He has remained NPO while in ED.  Awaiting surgery return call at this time.  Pt and family updated about findings and plan for surgical consult and admission.   I personally performed the services described in this documentation, which was scribed in my presence. The recorded information has been reviewed and considered.       Ethelda Chick, MD 09/11/11 [phone]

## 2011-09-09 ENCOUNTER — Encounter: Payer: Self-pay | Admitting: *Deleted

## 2011-09-09 DIAGNOSIS — K56609 Unspecified intestinal obstruction, unspecified as to partial versus complete obstruction: Secondary | ICD-10-CM

## 2011-09-09 LAB — CBC
HCT: 42.8 % (ref 39.0–52.0)
MCH: 31.6 pg (ref 26.0–34.0)
MCV: 93.2 fL (ref 78.0–100.0)
RBC: 4.59 MIL/uL (ref 4.22–5.81)
WBC: 6.8 10*3/uL (ref 4.0–10.5)

## 2011-09-09 MED ORDER — ONDANSETRON HCL 4 MG/2ML IJ SOLN: 4.0000 mg | Freq: Four times a day (QID) | INTRAMUSCULAR | Status: DC | PRN

## 2011-09-09 MED ORDER — TRAVOPROST (BAK FREE) 0.004 % OP SOLN
1.0000 [drp] | Freq: Every day | OPHTHALMIC | Status: DC
  Administered 2011-09-09 – 2011-09-13 (×5): 1 [drp] via OPHTHALMIC
  Filled 2011-09-09: qty 2.5

## 2011-09-09 MED ORDER — ENOXAPARIN SODIUM 40 MG/0.4ML ~~LOC~~ SOLN
40.0000 mg | SUBCUTANEOUS | Status: DC
  Administered 2011-09-09: 40 mg via SUBCUTANEOUS
  Filled 2011-09-09: qty 0.4

## 2011-09-09 MED ORDER — TRAVOPROST 0.004 % OP SOLN: 1.0000 [drp] | Freq: Every day | OPHTHALMIC | Status: DC

## 2011-09-09 MED ORDER — POTASSIUM CHLORIDE IN NACL 20-0.9 MEQ/L-% IV SOLN
INTRAVENOUS | Status: DC
  Administered 2011-09-09 – 2011-09-11 (×8): via INTRAVENOUS
  Administered 2011-09-11: 125 mL/h via INTRAVENOUS
  Administered 2011-09-12: 06:00:00 via INTRAVENOUS
  Administered 2011-09-12: 125 mL/h via INTRAVENOUS
  Administered 2011-09-13: via INTRAVENOUS
  Filled 2011-09-09 (×16): qty 1000

## 2011-09-09 MED ORDER — MORPHINE SULFATE 2 MG/ML IJ SOLN
2.0000 mg | INTRAMUSCULAR | Status: DC | PRN
  Administered 2011-09-09 – 2011-09-12 (×4): 2 mg via INTRAVENOUS
  Filled 2011-09-09 (×3): qty 1

## 2011-09-09 NOTE — H&P (Signed)
Reason for Consult: bowel obstruction Referring Physician: Andrey Mccaskill is an 78 y.o. male.  HPI: this patient was transfer from med center [facility] by Dr. Karma Ganja for evaluation of a 2-3 day history of increasing abdominal pain. He says that he had some mild symptoms of the last 2-3 days but most of his symptoms began earlier this morning with increasing abdominal pain. He also had nausea and "the heaves" but did not have any vomiting until he was in the emergency room. He had some diarrhea this morning but otherwise he says that his bowels have been normal without any blood in the stools or melena he is of normal frequency as well. He has never had a colonoscopy. He says that he has been passing gas but over the last few days he has been more belchy. He denies any fevers or chills. He denies any bulges in his groin.  Past Medical History  Diagnosis Date  . Atrial tachycardia     nonsustained  . Hiatal hernia   . 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 Glaucoma   . Second degree Mobitz II AV block     s/p PPM   . Macular degeneration     Past Surgical History  Procedure Date  . Appendectomy   . Pacemaker insertion 11/07/08    St Jude implanted by Dr Johney Frame    History reviewed. No pertinent family history.  Social History:  reports that he quit smoking about 65 years ago. His smoking use included Cigarettes. He has a 20 pack-year smoking history. He quit smokeless tobacco use about 63 years ago. His smokeless tobacco use included Chew. He reports that he does not drink alcohol or use illicit drugs.  Allergies: No Known Allergies  Medications: I have reviewed the patient's current medications.  Results for orders placed during the hospital encounter of 09/08/11 (from the past 48 hour(s))  URINALYSIS, ROUTINE W REFLEX MICROSCOPIC     Status: Abnormal   Collection Time   09/08/11  8:33 PM      Component Value Range Comment   Color, Urine  YELLOW  YELLOW     APPearance CLOUDY (*) CLEAR     Specific Gravity, Urine 1.022  1.005 - 1.030     pH 5.0  5.0 - 8.0     Glucose, UA NEGATIVE  NEGATIVE (mg/dL)    Hgb urine dipstick NEGATIVE  NEGATIVE     Bilirubin Urine SMALL (*) NEGATIVE     Ketones, ur 15 (*) NEGATIVE (mg/dL)    Protein, ur NEGATIVE  NEGATIVE (mg/dL)    Urobilinogen, UA 0.2  0.0 - 1.0 (mg/dL)    Nitrite NEGATIVE  NEGATIVE     Leukocytes, UA NEGATIVE  NEGATIVE  MICROSCOPIC NOT DONE ON URINES WITH NEGATIVE PROTEIN, BLOOD, LEUKOCYTES, NITRITE, OR GLUCOSE <1000 mg/dL.  CBC     Status: Abnormal   Collection Time   09/08/11 10:09 PM      Component Value Range Comment   WBC 13.6 (*) 4.0 - 10.5 (K/uL)    RBC 5.41  4.22 - 5.81 (MIL/uL)    Hemoglobin 17.2 (*) 13.0 - 17.0 (g/dL)    HCT 16.1  09.6 - 04.5 (%)    MCV 90.2  78.0 - 100.0 (fL)    MCH 31.8  26.0 - 34.0 (pg)    MCHC 35.2  30.0 - 36.0 (g/dL)    RDW 40.9  81.1 - 91.4 (%)  Platelets 148 (*) 150 - 400 (K/uL)   COMPREHENSIVE METABOLIC PANEL     Status: Abnormal   Collection Time   09/08/11 10:09 PM      Component Value Range Comment   Sodium 138  135 - 145 (mEq/L)    Potassium 4.3  3.5 - 5.1 (mEq/L)    Chloride 98  96 - 112 (mEq/L)    CO2 28  19 - 32 (mEq/L)    Glucose, Bld 156 (*) 70 - 99 (mg/dL)    BUN 25 (*) 6 - 23 (mg/dL)    Creatinine, Ser 9.60  0.50 - 1.35 (mg/dL)    Calcium 45.4  8.4 - 10.5 (mg/dL)    Total Protein 8.0  6.0 - 8.3 (g/dL)    Albumin 4.2  3.5 - 5.2 (g/dL)    AST 23  0 - 37 (U/L)    ALT 13  0 - 53 (U/L)    Alkaline Phosphatase 83  39 - 117 (U/L)    Total Bilirubin 0.7  0.3 - 1.2 (mg/dL)    GFR calc non Af Amer 50 (*) >90 (mL/min)    GFR calc Af Amer 58 (*) >90 (mL/min)   LIPASE, BLOOD     Status: Normal   Collection Time   09/08/11 10:09 PM      Component Value Range Comment   Lipase 17  11 - 59 (U/L)     Ct Abdomen Pelvis W Contrast  09/08/2011  *RADIOLOGY REPORT*  Clinical Data: Periumbilical abdominal pain, nausea, and  distension for one or two days.  CT ABDOMEN AND PELVIS WITH CONTRAST  Technique:  Multidetector CT imaging of the abdomen and pelvis was performed following the standard protocol during bolus administration of intravenous contrast.  Contrast: OMNIPAQUE IOHEXOL 300 MG/ML  SOLN  Comparison: None.  Findings: Interstitial and airspace changes in the lung bases suggest edema or pneumonia.  Mild cardiac enlargement.  Coronary artery calcification.  Calcified granulomas in the liver and spleen.  Small cyst in the caudate and medial segment left lobe of liver, measuring up to about 8 mm diameter.  Multiple large stones in the gallbladder without gallbladder wall thickening or distension.  No bile duct dilatation.  The pancreas and adrenal glands are unremarkable.  The kidneys are atrophic but demonstrate symmetrical nephrograms.  No solid mass in either kidney.  Cyst in the lower pole of the right kidney measures 1.6 cm diameter. Calcification and thrombus in the abdominal aorta without significant aneurysm.  Bilateral iliac artery ectasia, measuring 1.3 cm on the right and 1.3 cm on the left.  No retroperitoneal lymphadenopathy.  The stomach is filled with food and contrast material.  No gastric wall thickening.  There is mild fluid distension of the small bowel with transition zone in the right lower quadrant at the distal ileum and with decompressed terminal ileum.  There is focal narrowing and enhancement of the small bowel wall at the level of transition zone.  This could represent adhesion but inflammatory stricture or mass lesion is not excluded. No free air or free fluid in the abdomen.  Prominent visceral adipose tissues.  Pelvis:  The colon is decompressed.  Sigmoid diverticulosis without diverticulitis.  Prostate gland is not enlarged.  There is a right inguinal hernia containing fat and fluid.  No bowel content is suggested.  The appendix is not visualized.  No free or loculated pelvic fluid collections.   Degenerative changes in the lumbar spine.  IMPRESSION: Changes of small bowel obstruction  with transition zone in the right lower quadrant distal ileum.  There is focal narrowing and enhancement at the zone of transition which might be due to adhesions, mass, or inflammatory stricture.  Cholelithiasis. Infiltration or edema in the lung bases.  Small right inguinal hernia containing fat and fluid.  Original Report Authenticated By: Marlon Pel, M.D.    All other review of systems negative or noncontributory except as stated in the HPI  Blood pressure 120/66, pulse 76, temperature 97.4 F (36.3 C), temperature source Oral, resp. rate 18, height 5\' 9"  (1.753 m), weight 171 lb (77.565 kg), SpO2 93.00%. General appearance: alert, cooperative and no distress Head: Normocephalic, without obvious abnormality, atraumatic Resp: nonlabored, no stridor or wheeze Cardio: normal rate GI: soft, no significant tenderness on exam currently, mild distension, no hernia on exam at his incision or in his groins, no peritoneal signs Extremities: extremities normal, atraumatic, no cyanosis or edema Pulses: 2+ and symmetric Skin: Skin color, texture, turgor normal. No rashes or lesions Neurologic: Grossly normal  Assessment/Plan: Small bowel obstruction He has what appears to be a small bowel obstruction in his distal ileum which appears very high-grade on CT scan but he seems to be doing pretty well clinically. He has a history of a perforated appendicitis as a teenager and a paramedian scar consistent with this procedure. I do not appreciate any hernias on exam. This is most likely an adhesive bowel obstruction but this could be a malignancy as well and I explained this with the patient and his family present. I discussed with him the options for surgical exploration versus observation and NG tube decompression and he already has an NG in place. It is putting out thick material and concern that he might need  surgical intervention, however, given the fact that he is really minimally symptomatic at this time I think it is reasonable to trial nonoperative management and if isn't improving daily, he will likely need operative intervention. Will place him on telemetry for observation.  Lodema Pilot DAVID 09/09/2011, 3:38 AM

## 2011-09-09 NOTE — Progress Notes (Signed)
250cc of bright red drainage from NGT. VS stable. MD notified. New orders received and followed out. Will continue to monitor.

## 2011-09-09 NOTE — ED Notes (Signed)
Dr. Biagio Quint has been notified of pt's arrival.

## 2011-09-09 NOTE — ED Notes (Signed)
ZOX:[id number]<AV> Expected date:09/09/11<BR> Expected time: 1:35 AM<BR> Means of arrival:Ambulance<BR> Comments:<BR> Transfer from [facility], SBO 89 yom

## 2011-09-09 NOTE — Progress Notes (Signed)
Subjective: Feeling better.  Pain resolved.  Still no flatus or BM  Objective: Vital signs in last 24 hours: Temp:  [97.4 F (36.3 C)-99 F (37.2 C)] 99 F (37.2 C) (05/27 0934) Pulse Rate:  [74-92] 75  (05/27 0934) Resp:  [16-18] 18  (05/27 0934) BP: (106-145)/(60-73) 106/60 mmHg (05/27 0934) SpO2:  [92 %-100 %] 92 % (05/27 0934) Weight:  [171 lb (77.565 kg)-171 lb 9.6 oz (77.837 kg)] 171 lb 9.6 oz (77.837 kg) (05/27 0503) Last BM Date: 09/08/11  Intake/Output from previous day: 05/26 0701 - 05/27 0700 In: -  Out: 125 [Emesis/NG output:125] Intake/Output this shift: Total I/O In: -  Out: 200 [Urine:200]  General appearance: alert, cooperative and no distress Resp: nonlabored GI: soft, NT, ND, no peritoneal signs, NG with dark bilious output  Lab Results:   [facility] 09/08/11 2209  WBC 13.6*  HGB 17.2*  HCT 48.8  PLT 148*   BMET  Basename 09/08/11 2209  NA 138  K 4.3  CL 98  CO2 28  GLUCOSE 156*  BUN 25*  CREATININE 1.20  CALCIUM 10.1   PT/INR No results found for this basename: LABPROT:2,INR:2 in the last 72 hours ABG No results found for this basename: PHART:2,PCO2:2,PO2:2,HCO3:2 in the last 72 hours  Studies/Results: Ct Abdomen Pelvis W Contrast  09/08/2011  *RADIOLOGY REPORT*  Clinical Data: Periumbilical abdominal pain, nausea, and distension for one or two days.  CT ABDOMEN AND PELVIS WITH CONTRAST  Technique:  Multidetector CT imaging of the abdomen and pelvis was performed following the standard protocol during bolus administration of intravenous contrast.  Contrast: OMNIPAQUE IOHEXOL 300 MG/ML  SOLN  Comparison: None.  Findings: Interstitial and airspace changes in the lung bases suggest edema or pneumonia.  Mild cardiac enlargement.  Coronary artery calcification.  Calcified granulomas in the liver and spleen.  Small cyst in the caudate and medial segment left lobe of liver, measuring up to about 8 mm diameter.  Multiple large stones in the  gallbladder without gallbladder wall thickening or distension.  No bile duct dilatation.  The pancreas and adrenal glands are unremarkable.  The kidneys are atrophic but demonstrate symmetrical nephrograms.  No solid mass in either kidney.  Cyst in the lower pole of the right kidney measures 1.6 cm diameter. Calcification and thrombus in the abdominal aorta without significant aneurysm.  Bilateral iliac artery ectasia, measuring 1.3 cm on the right and 1.3 cm on the left.  No retroperitoneal lymphadenopathy.  The stomach is filled with food and contrast material.  No gastric wall thickening.  There is mild fluid distension of the small bowel with transition zone in the right lower quadrant at the distal ileum and with decompressed terminal ileum.  There is focal narrowing and enhancement of the small bowel wall at the level of transition zone.  This could represent adhesion but inflammatory stricture or mass lesion is not excluded. No free air or free fluid in the abdomen.  Prominent visceral adipose tissues.  Pelvis:  The colon is decompressed.  Sigmoid diverticulosis without diverticulitis.  Prostate gland is not enlarged.  There is a right inguinal hernia containing fat and fluid.  No bowel content is suggested.  The appendix is not visualized.  No free or loculated pelvic fluid collections.  Degenerative changes in the lumbar spine.  IMPRESSION: Changes of small bowel obstruction with transition zone in the right lower quadrant distal ileum.  There is focal narrowing and enhancement at the zone of transition which might be due  to adhesions, mass, or inflammatory stricture.  Cholelithiasis. Infiltration or edema in the lung bases.  Small right inguinal hernia containing fat and fluid.  Original Report Authenticated By: Marlon Pel, M.D.    Anti-infectives: Anti-infectives    None      Assessment/Plan: s/p * No surgery found * he seems to be feeling better but still no sign of bowel function.  As  long as he continues to improve, I think that we can try another day of nonop therapy.  LOS: 1 day    Lodema Pilot DAVID 09/09/2011

## 2011-09-10 ENCOUNTER — Inpatient Hospital Stay (HOSPITAL_COMMUNITY): Payer: Medicare Other

## 2011-09-10 LAB — BASIC METABOLIC PANEL
CO2: 25 mEq/L (ref 19–32)
Chloride: 106 mEq/L (ref 96–112)
Creatinine, Ser: 1.19 mg/dL (ref 0.50–1.35)
GFR calc Af Amer: 58 mL/min — ABNORMAL LOW (ref >90)
Potassium: 4.3 mEq/L (ref 3.5–5.1)

## 2011-09-10 LAB — OCCULT BLOOD GASTRIC / DUODENUM (SPECIMEN CUP): Occult Blood, Gastric: POSITIVE — AB

## 2011-09-10 LAB — CBC
HCT: 41.5 % (ref 39.0–52.0)
Hemoglobin: 13.7 g/dL (ref 13.0–17.0)
MCV: 94.3 fL (ref 78.0–100.0)
RBC: 4.4 MIL/uL (ref 4.22–5.81)
RDW: 13.5 % (ref 11.5–15.5)
WBC: 7.3 10*3/uL (ref 4.0–10.5)

## 2011-09-10 MED ORDER — FAMOTIDINE IN NACL 20-0.9 MG/50ML-% IV SOLN
20.0000 mg | Freq: Two times a day (BID) | INTRAVENOUS | Status: DC
  Administered 2011-09-10 – 2011-09-14 (×9): 20 mg via INTRAVENOUS
  Filled 2011-09-10 (×10): qty 50

## 2011-09-10 NOTE — Progress Notes (Signed)
   CARE MANAGEMENT NOTE 09/10/2011  Patient:  Austin Pittman, Austin Pittman   Account Number:  [id number]  Date Initiated:  09/10/2011  Documentation initiated by:  Jiles Crocker  Subjective/Objective Assessment:   ADMITTED WITH ABDOMINAL PAIN, SBO     Action/Plan:   LIVES AT HOME ALONE; DISPOSITION DEPENDS ON PT/OT EVALS;   Anticipated DC Date:  09/17/2011   Anticipated DC Plan:  HOME W HOME HEALTH SERVICES          Status of service:  In process, will continue to follow Medicare Important Message given?  NA - LOS <3 / Initial given by admissions (If response is "NO", the following Medicare IM given date fields will be blank)  Per UR Regulation:  Reviewed for med. necessity/level of care/duration of stay  Comments:  09/10/2011- B Dmetrius Ambs RN, BSN, MHA

## 2011-09-10 NOTE — Progress Notes (Signed)
Gastroccult positive from NGT, c diff and stool cx sent. Austin Pittman

## 2011-09-10 NOTE — Progress Notes (Signed)
Room - 1430  Subjective: Having diarrhea now. NG is clamped but drainage is dark colored.  Multiple BM's today.  Objective: Vital signs in last 24 hours: Temp:  [97.9 F (36.6 C)-98.9 F (37.2 C)] 97.9 F (36.6 C) (05/28 0510) Pulse Rate:  [70-88] 70  (05/28 0510) Resp:  [16-18] 16  (05/28 0510) BP: (109-128)/(58-74) 109/63 mmHg (05/28 0510) SpO2:  [91 %-92 %] 92 % (05/28 0510) Last BM Date: 09/08/11  + stools yesterday, 1050 NG, not much urine output, afebrile, VSS, labs OK  Intake/Output from previous day: 05/27 0701 - 05/28 0700 In: 1694.6 [I.V.:1664.6; NG/GT:30] Out: 1600 [Urine:550; Emesis/NG output:1050] Intake/Output this shift: Total I/O In: 30 [NG/GT:30] Out: 301 [Urine:300; Stool:1]  General appearance: alert, cooperative and no distress Resp: clear to auscultation bilaterally GI: soft, non-tender; bowel sounds normal; no masses,  no organomegaly  Lab Results:   Basename 09/10/11 0430 09/09/11 2000  WBC 7.3 6.8  HGB 13.7 14.5  HCT 41.5 42.8  PLT 128* 125*    BMET  Basename 09/10/11 0430 09/08/11 2209  NA 142 138  K 4.3 4.3  CL 106 98  CO2 25 28  GLUCOSE 91 156*  BUN 26* 25*  CREATININE 1.19 1.20  CALCIUM 8.1* 10.1   PT/INR No results found for this basename: LABPROT:2,INR:2 in the last 72 hours   Lab 09/08/11 2209  AST 23  ALT 13  ALKPHOS 83  BILITOT 0.7  PROT 8.0  ALBUMIN 4.2     Lipase     Component Value Date/Time   LIPASE 17 09/08/2011 2209     Studies/Results: Ct Abdomen Pelvis W Contrast  09/08/2011  *RADIOLOGY REPORT*  Clinical Data: Periumbilical abdominal pain, nausea, and distension for one or two days.  CT ABDOMEN AND PELVIS WITH CONTRAST  Technique:  Multidetector CT imaging of the abdomen and pelvis was performed following the standard protocol during bolus administration of intravenous contrast.  Contrast: OMNIPAQUE IOHEXOL 300 MG/ML  SOLN  Comparison: None.  Findings: Interstitial and airspace changes in the lung  bases suggest edema or pneumonia.  Mild cardiac enlargement.  Coronary artery calcification.  Calcified granulomas in the liver and spleen.  Small cyst in the caudate and medial segment left lobe of liver, measuring up to about 8 mm diameter.  Multiple large stones in the gallbladder without gallbladder wall thickening or distension.  No bile duct dilatation.  The pancreas and adrenal glands are unremarkable.  The kidneys are atrophic but demonstrate symmetrical nephrograms.  No solid mass in either kidney.  Cyst in the lower pole of the right kidney measures 1.6 cm diameter. Calcification and thrombus in the abdominal aorta without significant aneurysm.  Bilateral iliac artery ectasia, measuring 1.3 cm on the right and 1.3 cm on the left.  No retroperitoneal lymphadenopathy.  The stomach is filled with food and contrast material.  No gastric wall thickening.  There is mild fluid distension of the small bowel with transition zone in the right lower quadrant at the distal ileum and with decompressed terminal ileum.  There is focal narrowing and enhancement of the small bowel wall at the level of transition zone.  This could represent adhesion but inflammatory stricture or mass lesion is not excluded. No free air or free fluid in the abdomen.  Prominent visceral adipose tissues.  Pelvis:  The colon is decompressed.  Sigmoid diverticulosis without diverticulitis.  Prostate gland is not enlarged.  There is a right inguinal hernia containing fat and fluid.  No bowel content  is suggested.  The appendix is not visualized.  No free or loculated pelvic fluid collections.  Degenerative changes in the lumbar spine.  IMPRESSION: Changes of small bowel obstruction with transition zone in the right lower quadrant distal ileum.  There is focal narrowing and enhancement at the zone of transition which might be due to adhesions, mass, or inflammatory stricture.  Cholelithiasis. Infiltration or edema in the lung bases.  Small right  inguinal hernia containing fat and fluid.  Original Report Authenticated By: Marlon Pel, M.D.   Medications:    . Travoprost (BAK Free)  1 drop Both Eyes QHS  . DISCONTD: enoxaparin  40 mg Subcutaneous Q24H   Assessment/Plan SBO hx of appendectomy ATRIAL TACHYCARDIA/Mobitz II AVB, with PTVP Hiatal Hernia Cholelithiasis. Small right inguinal hernia.   Plan:  SBO, now with diarrhea, Ng clamped but looks dark and like old blood...  Will resume suction check for guaiac.  Recheck films. Stool culture, c diff.   LOS: 2 days   JENNINGS,WILLARD 09/10/2011  KUB looks better today.  More air in colon.  Seems to be improving.  No concerns.  Ovidio Kin, MD, [facility] Surgery Pager: [phone] Office phone:  [phone]

## 2011-09-11 LAB — CBC
MCHC: 32.7 g/dL (ref 30.0–36.0)
Platelets: 120 10*3/uL — ABNORMAL LOW (ref 150–400)
RDW: 13.4 % (ref 11.5–15.5)
WBC: 9.8 10*3/uL (ref 4.0–10.5)

## 2011-09-11 LAB — MAGNESIUM: Magnesium: 1.8 mg/dL (ref 1.5–2.5)

## 2011-09-11 LAB — BASIC METABOLIC PANEL
BUN: 21 mg/dL (ref 6–23)
GFR calc Af Amer: 70 mL/min — ABNORMAL LOW (ref >90)
GFR calc non Af Amer: 60 mL/min — ABNORMAL LOW (ref >90)
Potassium: 4.3 mEq/L (ref 3.5–5.1)
Sodium: 140 mEq/L (ref 135–145)

## 2011-09-11 MED ORDER — HYDROMORPHONE HCL PF 1 MG/ML IJ SOLN
1.0000 mg | Freq: Once | INTRAMUSCULAR | Status: AC
  Administered 2011-09-11: 1 mg via INTRAVENOUS
  Filled 2011-09-11: qty 1

## 2011-09-11 NOTE — Progress Notes (Signed)
Pt has become more and more confused througout the day.  He has had periods of restlessness wanting to go home.  At times, he has hallucinated thinking he sees deer outside and calling for his neighbor next door.  His daughter spend most of day with him and verbalizes that she has never seen him like this.  [place] with her living next door to him.  Instructed her that his does happen frequently to elderly people with they are hospitalized and taken out of their normal environment.  She verbalizes understanding.

## 2011-09-11 NOTE — Progress Notes (Signed)
  Subjective: Says he feels better, wants NG out.  His daughter says he's very confused seeing deer out the window, easily agitated. Has not been up to walk since admit.  Objective: Vital signs in last 24 hours: Temp:  [98.2 F (36.8 C)-99.1 F (37.3 C)] 98.2 F (36.8 C) (05/29 0514) Pulse Rate:  [59-71] 65  (05/29 0514) Resp:  [16-18] 18  (05/29 0514) BP: (124-134)/(65-71) 134/65 mmHg (05/29 0514) SpO2:  [91 %-95 %] 91 % (05/29 0514) Last BM Date:  (pt does not recall)  NG drainage is 1300 yesterday, 1 stool, VSS, gastric occult is positive, c diff is negative, stool culture is pending.  Intake/Output from previous day: 2022-09-29 0701 - 05/29 0700 In: 4015 [I.V.:3625; NG/GT:340; IV Piggyback:50] Out: 2701 [Urine:1400; Emesis/NG output:1300; Stool:1] Intake/Output this shift: Total I/O In: -  Out: 450 [Urine:450]  General appearance: alert, cooperative and confused. Resp: Rales Right base,. GI: soft, non-tender; bowel sounds normal; no masses,  no organomegaly and some flatus, no BM today per family  Lab Results:   Basename 09/11/11 0357 09/29/2011 0430  WBC 9.8 7.3  HGB 13.5 13.7  HCT 41.3 41.5  PLT 120* 128*    BMET  Basename 09/11/11 0357 09-29-2011 0430  NA 140 142  K 4.3 4.3  CL 107 106  CO2 21 25  GLUCOSE 70 91  BUN 21 26*  CREATININE 1.03 1.19  CALCIUM 8.1* 8.1*   PT/INR No results found for this basename: LABPROT:2,INR:2 in the last 72 hours   Lab 09/08/11 2209  AST 23  ALT 13  ALKPHOS 83  BILITOT 0.7  PROT 8.0  ALBUMIN 4.2     Lipase     Component Value Date/Time   LIPASE 17 09/08/2011 2209     Studies/Results: Dg Abd 2 Views  29-Sep-2011  *RADIOLOGY REPORT*  Clinical Data: 78 year old male with abdominal pain, small bowel obstruction, diarrhea.  ABDOMEN - 2 VIEW  Comparison: CT abdomen pelvis 09/08/2011.  Findings: Cholelithiasis.  Enteric tube in place terminating in the proximal stomach.  No pneumoperitoneum.  A small right pleural effusion  is evident.Interval mildly improved bowel gas pattern, residual gas filled small bowel loops now measure up to 25 mm diameter, previously 30 mm.  There is gas in the colon to the level of the rectum. Stable visualized osseous structures.  IMPRESSION: 1.  Decreased small bowel obstruction, partial obstruction now suspected. 2.  No pneumoperitoneum. 3.  Right pleural effusion.  Cholelithiasis.  Original Report Authenticated By: Harley Hallmark, M.D.    Medications:    . famotidine (PEPCID) IV  20 mg Intravenous Q12H  . Travoprost (BAK Free)  1 drop Both Eyes QHS    Assessment/Plan SBO hx of appendectomy  ATRIAL TACHYCARDIA/Mobitz II AVB, with PTVP  Hiatal Hernia  Cholelithiasis.  Small right inguinal hernia.     Plan:  Clamping trials, today, ask OT/PT to see and get him moving some.  Lives alone, with walker at home.  Daughter is concerned about his being confused and going home alone like this.  If he continues like this tomorrow we will ask case manager to see.  Recheck film in AM.   LOS: 3 days    JENNINGS,WILLARD 09/11/2011  Confused.  But has maintained NGT so far.  Appears to be getting better.  As above.  Need to get NGT out and have him eating before he can be discharged.  Ovidio Kin, MD, [facility] Surgery Pager: [phone] Office phone:  [phone]

## 2011-09-12 ENCOUNTER — Encounter (HOSPITAL_COMMUNITY): Payer: Self-pay | Admitting: General Surgery

## 2011-09-12 ENCOUNTER — Inpatient Hospital Stay (HOSPITAL_COMMUNITY): Payer: Medicare Other

## 2011-09-12 DIAGNOSIS — F05 Delirium due to known physiological condition: Secondary | ICD-10-CM

## 2011-09-12 DIAGNOSIS — K56609 Unspecified intestinal obstruction, unspecified as to partial versus complete obstruction: Secondary | ICD-10-CM | POA: Diagnosis present

## 2011-09-12 DIAGNOSIS — H3530 Unspecified macular degeneration: Secondary | ICD-10-CM | POA: Diagnosis present

## 2011-09-12 HISTORY — DX: Delirium due to known physiological condition: F05

## 2011-09-12 HISTORY — DX: Unspecified macular degeneration: H35.30

## 2011-09-12 MED ORDER — HALOPERIDOL LACTATE 5 MG/ML IJ SOLN
2.5000 mg | Freq: Once | INTRAMUSCULAR | Status: AC
  Administered 2011-09-12: 03:00:00 via INTRAVENOUS
  Filled 2011-09-12: qty 1

## 2011-09-12 NOTE — Evaluation (Signed)
Occupational Therapy Evaluation Patient Details Name: Austin Pittman MRN: [id number] DOB: 1915/05/02 Today's Date: 09/12/2011 Time: 5621-3086 OT Time Calculation (min): 31 min  OT Assessment / Plan / Recommendation Clinical Impression  Pt admitted with SBO.  Pt was independent in ADL and ambulated with a cane PTA, but there is concern that pt is not safe driving given his vision.  Will follow acutely.  Recommend HHOT upon d/c to address safety concerns at home and to evaluate tub equipment needs.    OT Assessment  Patient needs continued OT Services    Follow Up Recommendations  Home health OT    Barriers to Discharge      Equipment Recommendations  None recommended by OT;Other (comment) (will defer tub equipment needs to [facility])    Recommendations for Other Services    Frequency  Min 2X/week    Precautions / Restrictions Precautions Precautions: Fall Restrictions Other Position/Activity Restrictions: NPO   Pertinent Vitals/Pain No pain    ADL  Eating/Feeding: NPO Where Assessed - Eating/Feeding: Chair Grooming: Simulated;Supervision/safety Where Assessed - Grooming: Supported sitting Upper Body Bathing: Set up;Simulated Where Assessed - Upper Body Bathing: Unsupported sitting Lower Body Bathing: Simulated;Supervision/safety Where Assessed - Lower Body Bathing: Unsupported sitting;Supported sit to stand Upper Body Dressing: Set up Where Assessed - Upper Body Dressing: Unsupported sitting Lower Body Dressing: Simulated;Supervision/safety Where Assessed - Lower Body Dressing: Unsupported sitting;Sopported sit to stand Toilet Transfer: Buyer, retail Method: Other (comment) (ambulating) Equipment Used: Rolling walker Transfers/Ambulation Related to ADLs: supervision for RW  ADL Comments: Pt with macular degeneration and unsteady gait at baseline.  Family with concerns about pt's safety.      OT Diagnosis: Generalized weakness;Disturbance of  vision  OT Problem List: Decreased knowledge of use of DME or AE;Impaired vision/perception;Impaired balance (sitting and/or standing) OT Treatment Interventions: Self-care/ADL training;DME and/or AE instruction;Patient/family education   OT Goals Acute Rehab OT Goals OT Goal Formulation: With patient Time For Goal Achievement: 09/19/11 Potential to Achieve Goals: Good ADL Goals Pt Will Perform Grooming: with modified independence;Standing at sink ADL Goal: Grooming - Progress: Goal set today Pt Will Perform Upper Body Bathing: with modified independence;Standing at sink ADL Goal: Upper Body Bathing - Progress: Goal set today Pt Will Perform Lower Body Bathing: with modified independence;Standing at sink;Sitting at sink ADL Goal: Lower Body Bathing - Progress: Goal set today Pt Will Perform Upper Body Dressing: with modified independence;Sitting, bed ADL Goal: Upper Body Dressing - Progress: Goal set today Pt Will Perform Lower Body Dressing: with modified independence;Sit to stand from bed ADL Goal: Lower Body Dressing - Progress: Goal set today Pt Will Transfer to Toilet: with modified independence;Regular height toilet;Ambulation ADL Goal: Toilet Transfer - Progress: Goal set today Pt Will Perform Toileting - Clothing Manipulation: with modified independence;Standing ADL Goal: Toileting - Clothing Manipulation - Progress: Goal set today  Visit Information  Last OT Received On: 09/12/11 Assistance Needed: +1    Subjective Data  Subjective: "I just worked in my garden last week." Patient Stated Goal: Return to gardening.   Prior Functioning  Home Living Lives With: Alone Available Help at Discharge: Family;Other (Comment) (daughter lives next door) Type of Home: House Home Access: Stairs to enter Home Layout: One level Bathroom Shower/Tub: Engineer, manufacturing systems: Standard Home Adaptive Equipment: Environmental consultant - rolling;Straight cane Prior Function Level of  Independence: Independent with assistive device(s) Able to Take Stairs?: Yes Driving: Yes Vocation: Retired Comments: pt very independent and active prior to this illness, though family expresses concern  about him driving and unsteady gait Communication Communication: HOH Dominant Hand: Right    Cognition  Overall Cognitive Status: Appears within functional limits for tasks assessed/performed Arousal/Alertness: Awake/alert Orientation Level: Appears intact for tasks assessed Behavior During Session: [facility] for tasks performed    Extremity/Trunk Assessment Right Upper Extremity Assessment RUE ROM/Strength/Tone: [facility] for tasks assessed Left Upper Extremity Assessment LUE ROM/Strength/Tone: WFL for tasks assessed Right Lower Extremity Assessment RLE ROM/Strength/Tone: WFL for tasks assessed RLE Sensation: WFL - Light Touch RLE Coordination: WFL - gross/fine motor Left Lower Extremity Assessment LLE ROM/Strength/Tone: WFL for tasks assessed LLE Sensation: WFL - Light Touch LLE Coordination: WFL - gross/fine motor Trunk Assessment Trunk Assessment: Normal   Mobility Transfers Transfers: Sit to Stand;Stand to Sit Sit to Stand: 5: Supervision;With armrests;From chair/3-in-1 Stand to Sit: 5: Supervision;To chair/3-in-1 Details for Transfer Assistance: VCs hand placement   Exercise    Balance    End of Session OT - End of Session Activity Tolerance: Patient tolerated treatment well Patient left: in chair;with family/visitor present;with call bell/phone within reach   Evern Bio 09/12/2011, 12:42 PM [phone]

## 2011-09-12 NOTE — Progress Notes (Signed)
Dr. Carolynne Edouard notified of patient becoming increasingly confused and agitated. Patient pulled ngt tube out 3 times.He state" you are not putting that back".  He convince he is being held against his will. Patient daughter Enrique Sack) called to inform of her father"s behavior. She stated" I  was there yesterday and daddy was mad at me. I don't know what help I can be. Order received from md and initiated. Will continue to monitor.

## 2011-09-12 NOTE — Progress Notes (Signed)
Room - 1430  PCP - Dr. Ardeen Garland Cardiologist - Dr. Johney Frame  Subjective: Still confused but not as bad as yesterday.  He says he's had some flatus, no BM  Objective: Vital signs in last 24 hours: Temp:  [97.9 F (36.6 C)-99.1 F (37.3 C)] 98.1 F (36.7 C) 2022/09/18 0519) Pulse Rate:  [69-70] 70  09-18-22 0519) Resp:  [16-18] 18  Sep 18, 2022 0519) BP: (116-137)/(53-69) 116/58 mmHg 2022/09/18 0519) SpO2:  [94 %-95 %] 95 % 2022-09-18 0519) Last BM Date: 09/10/11  150 recorded from NG, film shows persistent PSBO, with gas in colon.   Afebrile, VSS,  Intake/Output from previous day: 05/29 0701 - 2022/09/18 0700 In: 3004.2 [I.V.:2854.2; NG/GT:100; IV Piggyback:50] Out: 2200 [Urine:2050; Emesis/NG output:150] Intake/Output this shift: Total I/O In: -  Out: 250 [Urine:250]  General appearance: alert, cooperative and no distress Resp: clear to auscultation bilaterally GI: soft, still distended some, not tender on palpation few bowel sounds.  Lab Results:   Basename 09/11/11 0357 09/10/11 0430  WBC 9.8 7.3  HGB 13.5 13.7  HCT 41.3 41.5  PLT 120* 128*    BMET  Basename 09/11/11 0357 09/10/11 0430  NA 140 142  K 4.3 4.3  CL 107 106  CO2 21 25  GLUCOSE 70 91  BUN 21 26*  CREATININE 1.03 1.19  CALCIUM 8.1* 8.1*   PT/INR No results found for this basename: LABPROT:2,INR:2 in the last 72 hours   Lab 09/08/11 2207-09-18  AST 23  ALT 13  ALKPHOS 83  BILITOT 0.7  PROT 8.0  ALBUMIN 4.2     Lipase     Component Value Date/Time   LIPASE 17 09/08/2011 2207-09-18     Studies/Results: Dg Abd 2 Views  2011-09-18  *RADIOLOGY REPORT*  Clinical Data: Partial small bowel obstruction.  ABDOMEN - 2 VIEW  Comparison: 09/10/2011.  Findings: There are mildly dilated loops of small bowel in the central abdomen, with associated air fluid levels. Nasogastric tube terminates in the stomach.  Gas is seen in the colon.  Overall pattern does not appear appreciably changed from 09/10/2011. Gallstones.  IMPRESSION:  1.   Persistent partial small bowel obstruction. 2.  Cholelithiasis.  Original Report Authenticated By: Reyes Ivan, M.D.   Dg Abd 2 Views  09/10/2011  *RADIOLOGY REPORT*  Clinical Data: 78 year old male with abdominal pain, small bowel obstruction, diarrhea.  ABDOMEN - 2 VIEW  Comparison: CT abdomen pelvis 09/08/2011.  Findings: Cholelithiasis.  Enteric tube in place terminating in the proximal stomach.  No pneumoperitoneum.  A small right pleural effusion is evident.Interval mildly improved bowel gas pattern, residual gas filled small bowel loops now measure up to 25 mm diameter, previously 30 mm.  There is gas in the colon to the level of the rectum. Stable visualized osseous structures.  IMPRESSION: 1.  Decreased small bowel obstruction, partial obstruction now suspected. 2.  No pneumoperitoneum. 3.  Right pleural effusion.  Cholelithiasis.  Original Report Authenticated By: Harley Hallmark, M.D.    Medications:    . famotidine (PEPCID) IV  20 mg Intravenous Q12H  . haloperidol lactate  2.5 mg Intravenous Once  .  HYDROmorphone (DILAUDID) injection  1 mg Intravenous Once  . Travoprost (BAK Free)  1 drop Both Eyes QHS    Assessment/Plan SBO hx of appendectomy  ATRIAL TACHYCARDIA - Pacemaker - Mobitz II AVB, with PTVP   Dr. Johney Frame - cardiologist. Hiatal Hernia  Cholelithiasis.  Small right inguinal hernia.  Macular degeneration  confusion   Plan:  I have removed his NG, and will try sips.  I have talked to family and told them to keep him up during the day and hopefully he will sleep at night.   If he fails this trial without the NG and liquids he will most likely need surgery. Recheck labs and film tomorrow   LOS: 4 days    JENNINGS,WILLARD 09/12/2011  Daughter - Assunta Found - [phone], (C) [phone]. I called her and reviewed the plan.   Actually looks good, but modest distention.   He actually had a BM while I was on the phone to his daughter.  Possibly advance diet  tomorrow.   He needs to get out of bed 4 to 5 times per day.  I discussed this with the nurse.  Ovidio Kin, MD, [facility] Surgery Pager: [phone] Office phone:  [phone]

## 2011-09-12 NOTE — Evaluation (Signed)
Physical Therapy Evaluation Patient Details Name: Austin Pittman MRN: [id number] DOB: May 12, 1915 Today's Date: 09/12/2011 Time: 4098-1191 PT Time Calculation (min): 25 min  PT Assessment / Plan / Recommendation Clinical Impression  78 y.o. male admitted with SBO. He was independent with ADLs and  mobility using a cane for ambulation PTA. Pt's daughter and niece were present for PT/OT eval and they expressed concern about pt's unsteady gait and his driving. Instructed pt in use of RW and recommended HHPT/OT for home safety assessment. Pt would benefit from acute PT to maximize safety and independence with mobility.    PT Assessment  Patient needs continued PT services    Follow Up Recommendations  Home health PT    Barriers to Discharge None pt lives alone but family is available PRN    lEquipment Recommendations  None recommended by PT    Recommendations for Other Services OT consult   Frequency Min 3X/week    Precautions / Restrictions Precautions Precautions: Fall   Pertinent Vitals/Pain *no c/o pain**      Mobility  Transfers Transfers: Stand to Sit Stand to Sit: 5: Supervision;To chair/3-in-1 Details for Transfer Assistance: VCs hand placement Ambulation/Gait Ambulation/Gait Assistance: 5: Supervision Ambulation Distance (Feet): 180 Feet Assistive device: Rolling walker Ambulation/Gait Assistance Details: VCs for positioning in RW, pt walks too far behind RW Gait Pattern: Within Functional Limits Gait velocity: increased    Exercises     PT Diagnosis: Generalized weakness  PT Problem List: Decreased safety awareness;Decreased knowledge of use of DME PT Treatment Interventions: DME instruction;Gait training   PT Goals Acute Rehab PT Goals PT Goal Formulation: With patient/family Time For Goal Achievement: 09/26/11 Potential to Achieve Goals: Good Pt will go Supine/Side to Sit: Independently PT Goal: Supine/Side to Sit - Progress: Goal set today Pt will go Sit  to Stand: with modified independence;with upper extremity assist PT Goal: Sit to Stand - Progress: Goal set today Pt will Ambulate: >150 feet;with supervision;with rolling walker (with correct positioning in RW) PT Goal: Ambulate - Progress: Goal set today  Visit Information  Last PT Received On: 09/12/11 Assistance Needed: +1 PT/OT Co-Evaluation/Treatment: Yes    Subjective Data  Subjective: I need to get home to work on my garden. I have 100 tomato plants! Patient Stated Goal: return to working in garden   Prior Functioning  Home Living Lives With: Alone Available Help at Discharge: Family Type of Home: House Home Access: Stairs to enter Home Layout: One level Bathroom Shower/Tub: Tub/shower unit Home Adaptive Equipment: Environmental consultant - rolling;Straight cane Prior Function Level of Independence: Independent with assistive device(s) Able to Take Stairs?: Yes Driving: Yes Vocation: Retired Comments: pt very independent and active prior to this illness, though family expresses concern about him driving and unsteady gait Communication Communication: HOH Dominant Hand: Right    Cognition  Overall Cognitive Status: Appears within functional limits for tasks assessed/performed Arousal/Alertness: Awake/alert Orientation Level: Appears intact for tasks assessed Behavior During Session: [facility] for tasks performed    Extremity/Trunk Assessment Right Upper Extremity Assessment RUE ROM/Strength/Tone: [facility] for tasks assessed Left Upper Extremity Assessment LUE ROM/Strength/Tone: WFL for tasks assessed Right Lower Extremity Assessment RLE ROM/Strength/Tone: WFL for tasks assessed RLE Sensation: WFL - Light Touch RLE Coordination: WFL - gross/fine motor Left Lower Extremity Assessment LLE ROM/Strength/Tone: WFL for tasks assessed LLE Sensation: WFL - Light Touch LLE Coordination: WFL - gross/fine motor Trunk Assessment Trunk Assessment: Normal   Balance    End of Session PT - End of  Session Activity  Tolerance: Patient tolerated treatment well Patient left: in chair;with call bell/phone within reach;with family/visitor present Nurse Communication: Mobility status   Tamala Ser 09/12/2011, 11:27 AM [phone]

## 2011-09-13 ENCOUNTER — Inpatient Hospital Stay (HOSPITAL_COMMUNITY): Payer: Medicare Other

## 2011-09-13 LAB — COMPREHENSIVE METABOLIC PANEL
ALT: 12 U/L (ref 0–53)
AST: 24 U/L (ref 0–37)
Albumin: 2.9 g/dL — ABNORMAL LOW (ref 3.5–5.2)
Alkaline Phosphatase: 62 U/L (ref 39–117)
Calcium: 8.4 mg/dL (ref 8.4–10.5)
Glucose, Bld: 109 mg/dL — ABNORMAL HIGH (ref 70–99)
Potassium: 3.9 mEq/L (ref 3.5–5.1)
Sodium: 135 mEq/L (ref 135–145)
Total Protein: 5.9 g/dL — ABNORMAL LOW (ref 6.0–8.3)

## 2011-09-13 LAB — CBC
Hemoglobin: 13.4 g/dL (ref 13.0–17.0)
MCHC: 34.3 g/dL (ref 30.0–36.0)
Platelets: 145 10*3/uL — ABNORMAL LOW (ref 150–400)
RDW: 13.1 % (ref 11.5–15.5)

## 2011-09-13 NOTE — Progress Notes (Signed)
PT/OT/ST Cancellation Note  ___Treatment cancelled today due to medical issues with patient which prohibited therapy  ___ Treatment cancelled today due to patient receiving procedure or test   __x_ Treatment cancelled today due to patient's refusal to participate despite much encouragement, pt stating "I dont want to right now. I am waiting on a call. I get enough exercise"  ___ Treatment cancelled today due to   Signature: Judithann Sauger OTR/L [phone] 09/13/2011

## 2011-09-13 NOTE — Progress Notes (Signed)
PT/OT/ST Cancellation Note  ___Treatment cancelled today due to medical issues with patient which prohibited therapy  ___ Treatment cancelled today due to patient receiving procedure or test   _x__ Treatment cancelled today due to patient's refusal to participate. Pt requested PT check back tomorrow.   ___ Treatment cancelled today due to   Signature: Rebeca Alert, PT [phone]

## 2011-09-13 NOTE — Progress Notes (Signed)
Subjective: Pleasantly confused, wants to go home, but has no idea where he is.  Up in BR when I arrived, but not sure what he did.  He says he had another BM.  Objective: Vital signs in last 24 hours: Temp:  [98.2 F (36.8 C)-98.8 F (37.1 C)] 98.2 F (36.8 C) 10/05/2022 0605) Pulse Rate:  [63-82] 82  10/05/2022 0605) Resp:  [16-18] 18  2022/10/05 0605) BP: (129-159)/(60-80) 159/78 mmHg 10-05-22 0605) SpO2:  [95 %-98 %] 96 % 10/05/22 0605) Last BM Date: 2011-10-05  2 stools recorded, VSS, labs OK, xray shows ongoing PSBO, 760 ML PO recorded yesterday  Intake/Output from previous day: 05/30 0701 - 10/05/22 0700 In: 4060 [P.O.:760; I.V.:3250; IV Piggyback:50] Out: 1200 [Urine:1200] Intake/Output this shift: Total I/O In: 240 [P.O.:240] Out: -   General appearance: alert, cooperative and no distress Resp: clear to auscultation bilaterally GI: slightly distended, +BS, not tender.  Lab Results:   Basename 2011-10-05 0354 09/11/11 0357  WBC 7.8 9.8  HGB 13.4 13.5  HCT 39.1 41.3  PLT 145* 120*    BMET  Basename 10-05-11 0354 09/11/11 0357  NA 135 140  K 3.9 4.3  CL 105 107  CO2 21 21  GLUCOSE 109* 70  BUN 15 21  CREATININE 1.01 1.03  CALCIUM 8.4 8.1*   PT/INR No results found for this basename: LABPROT:2,INR:2 in the last 72 hours   Lab 10-05-2011 0354 09/08/11 2209  AST 24 23  ALT 12 13  ALKPHOS 62 83  BILITOT 0.6 0.7  PROT 5.9* 8.0  ALBUMIN 2.9* 4.2     Lipase     Component Value Date/Time   LIPASE 17 09/08/2011 2209     Studies/Results: Dg Abd 2 Views  10/05/11  *RADIOLOGY REPORT*  Clinical Data: Follow-up small bowel obstruction  ABDOMEN - 2 VIEW  Comparison: Abdominal radiographs 09/12/2011 and CT abdomen pelvis 09/08/2011  Findings: There are persistent mildly dilated small bowel loops in the central abdomen and right lower quadrant, similar to the abdominal radiograph of 09/12/2011.  There are a few air-fluid levels on the upright view within dilated small bowel  loops.  Gas is seen within the nondilated colon and rectum.  Mild gaseous distention of the stomach.  Interval removal of nasogastric tube.  No free intraperitoneal air is identified on the right side up decubitus view.  Degenerative changes of the lumbar spine.  IMPRESSION: No significant change in partial small bowel obstruction pattern compared to 09/12/2011.  Original Report Authenticated By: Britta Mccreedy, M.D.   Dg Abd 2 Views  09/12/2011  *RADIOLOGY REPORT*  Clinical Data: Partial small bowel obstruction.  ABDOMEN - 2 VIEW  Comparison: 09/10/2011.  Findings: There are mildly dilated loops of small bowel in the central abdomen, with associated air fluid levels. Nasogastric tube terminates in the stomach.  Gas is seen in the colon.  Overall pattern does not appear appreciably changed from 09/10/2011. Gallstones.  IMPRESSION:  1.  Persistent partial small bowel obstruction. 2.  Cholelithiasis.  Original Report Authenticated By: Reyes Ivan, M.D.    Medications:    . famotidine (PEPCID) IV  20 mg Intravenous Q12H  . Travoprost (BAK Free)  1 drop Both Eyes QHS    Assessment/Plan SBO - hx of appendectomy.  Though not sure this is a gastroenteritis or a small bowel obstruction.  He has had distention and diarrhea this admission.  ATRIAL TACHYCARDIA - Pacemaker - Mobitz II AVB, with PTVP   Dr. Johney Frame - cardiologist.  Hiatal  Hernia  Cholelithiasis.  Small right inguinal hernia.  Macular degeneration  confusion  Plan:  He is on a regular diet, I will watch him and see how he does.    LOS: 5 days    JENNINGS,WILLARD 09/13/2011  Chip Boer, daughter, at bedside.  We reviewed plan.  Will plan discharge in AM.  I will order home PT and OT tonight (I think I put it in Epic).  Ovidio Kin, MD, [facility] Pager: [phone] Office phone:  [phone]

## 2011-09-14 LAB — STOOL CULTURE

## 2011-09-14 NOTE — Discharge Instructions (Signed)
Call our office if any problems  [phone]  Resume normal activity slowly

## 2011-09-14 NOTE — Progress Notes (Signed)
Cm spoke with pt with daughter at bedside concerning dc planning. MD order for HHPT. Per pt choice [facility] to provide Hh services. [facility] rep Talmadge Coventry notified of new referral.   Leonie Green [phone]

## 2011-09-14 NOTE — Progress Notes (Signed)
  Subjective: Doing well.  Eating well.  Pain resolved.  +BM  Objective: Vital signs in last 24 hours: Temp:  [97.3 F (36.3 C)-98 F (36.7 C)] 98 F (36.7 C) (06/01 0454) Pulse Rate:  [72-74] 73  (06/01 0454) Resp:  [18] 18  (06/01 0454) BP: (145-151)/(70-77) 148/77 mmHg (06/01 0454) SpO2:  [97 %-98 %] 97 % (06/01 0454) Last BM Date: 2011-09-22  Intake/Output from previous day: 09/22/22 0701 - 06/01 0700 In: 1720 [P.O.:720; I.V.:1000] Out: -  Intake/Output this shift:    Looks good Abdomen benign  Lab Results:   Basename 2011/09/22 0354  WBC 7.8  HGB 13.4  HCT 39.1  PLT 145*   BMET  Basename 09/22/2011 0354  NA 135  K 3.9  CL 105  CO2 21  GLUCOSE 109*  BUN 15  CREATININE 1.01  CALCIUM 8.4   PT/INR No results found for this basename: LABPROT:2,INR:2 in the last 72 hours ABG No results found for this basename: PHART:2,PCO2:2,PO2:2,HCO3:2 in the last 72 hours  Studies/Results: Dg Abd 2 Views  September 22, 2011  *RADIOLOGY REPORT*  Clinical Data: Follow-up small bowel obstruction  ABDOMEN - 2 VIEW  Comparison: Abdominal radiographs 09/12/2011 and CT abdomen pelvis 09/08/2011  Findings: There are persistent mildly dilated small bowel loops in the central abdomen and right lower quadrant, similar to the abdominal radiograph of 09/12/2011.  There are a few air-fluid levels on the upright view within dilated small bowel loops.  Gas is seen within the nondilated colon and rectum.  Mild gaseous distention of the stomach.  Interval removal of nasogastric tube.  No free intraperitoneal air is identified on the right side up decubitus view.  Degenerative changes of the lumbar spine.  IMPRESSION: No significant change in partial small bowel obstruction pattern compared to 09/12/2011.  Original Report Authenticated By: Britta Mccreedy, M.D.    Anti-infectives: Anti-infectives    None      Assessment/Plan: s/p * No surgery found *  Discharge home  LOS: 6 days    Cassady Turano  A 09/14/2011

## 2011-09-16 NOTE — Discharge Summary (Signed)
Physician Discharge Summary  Patient ID: Austin Pittman MRN: [id number] DOB/AGE: Aug 30, 1915 78 y.o.  Admit date: 09/08/2011 Discharge date: 09/14/11  Admission Diagnoses:  SBO hx of appendectomy  ATRIAL TACHYCARDIA/Mobitz II AVB, with PTVP  Hiatal Hernia  Cholelithiasis.  Small right inguinal hernia.  Discharge Diagnoses: same Principal Problem:  *SBO (small bowel obstruction) Active Problems:  Macular degeneration  Acute confusion due to known medical condition   PROCEDURES: None  Hospital Course: this patient was transfer from med center [facility] by Dr. Karma Ganja for evaluation of a 2-3 day history of increasing abdominal pain. He says that he had some mild symptoms of the last 2-3 days but most of his symptoms began earlier this morning with increasing abdominal pain. He also had nausea and "the heaves" but did not have any vomiting until he was in the emergency room. He had some diarrhea this morning but otherwise he says that his bowels have been normal without any blood in the stools or melena he is of normal frequency as well. He has never had a colonoscopy. He says that he has been passing gas but over the last few days he has been more belchy. He denies any fevers or chills. He denies any bulges in his groin. He was admitted and placed on NG suction.  He developed diarrhea on his 2nd hospital day.  We checked for C. Diff, which was negative.  Repeat films showed ongoing SBO, which slowly improved.  He became very confused.  We eventually removed the NG with at least partial resolution of his obstruction, by film.  His diet was advanced and by 09/14/11 he was ready for discharge by Dr. Magnus Ivan.  Disposition: 01-Home or Self Care   Medication List  As of 09/16/2011  7:22 PM   TAKE these medications         ibuprofen 200 MG tablet   Commonly known as: ADVIL,MOTRIN   Take 600 mg by mouth every 6 (six) hours as needed. Patient used this medication for pain.      travoprost (benzalkonium)  0.004 % ophthalmic solution   Commonly known as: TRAVATAN   Place 1 drop into both eyes at bedtime.           Follow-up Information    Follow up with [facility],MD, MD. (As needed)    Contact information:   [address] [place] [address] [phone]          Signed: Sherrie George 09/16/2011, 7:22 PM

## 2011-09-21 ENCOUNTER — Emergency Department (HOSPITAL_COMMUNITY): Payer: Medicare Other

## 2011-09-21 ENCOUNTER — Inpatient Hospital Stay (HOSPITAL_COMMUNITY)
Admission: EM | Admit: 2011-09-21 | Discharge: 2011-09-27 | DRG: 330 | Disposition: A | Discharge status: Skilled Nursing Facility | Payer: Medicare Other | Attending: General Surgery | Admitting: General Surgery

## 2011-09-21 ENCOUNTER — Encounter (HOSPITAL_COMMUNITY): Payer: Self-pay

## 2011-09-21 DIAGNOSIS — I4891 Unspecified atrial fibrillation: Secondary | ICD-10-CM | POA: Diagnosis present

## 2011-09-21 DIAGNOSIS — I441 Atrioventricular block, second degree: Secondary | ICD-10-CM | POA: Diagnosis present

## 2011-09-21 DIAGNOSIS — K449 Diaphragmatic hernia without obstruction or gangrene: Secondary | ICD-10-CM | POA: Diagnosis present

## 2011-09-21 DIAGNOSIS — Q430 Meckel's diverticulum (displaced) (hypertrophic): Secondary | ICD-10-CM

## 2011-09-21 DIAGNOSIS — K56699 Other intestinal obstruction unspecified as to partial versus complete obstruction: Secondary | ICD-10-CM | POA: Diagnosis present

## 2011-09-21 DIAGNOSIS — H3530 Unspecified macular degeneration: Secondary | ICD-10-CM | POA: Diagnosis present

## 2011-09-21 DIAGNOSIS — D1330 Benign neoplasm of unspecified part of small intestine: Secondary | ICD-10-CM | POA: Diagnosis present

## 2011-09-21 DIAGNOSIS — Z950 Presence of cardiac pacemaker: Secondary | ICD-10-CM

## 2011-09-21 DIAGNOSIS — K5669 Other intestinal obstruction: Principal | ICD-10-CM | POA: Diagnosis present

## 2011-09-21 DIAGNOSIS — I442 Atrioventricular block, complete: Secondary | ICD-10-CM | POA: Diagnosis present

## 2011-09-21 DIAGNOSIS — K56609 Unspecified intestinal obstruction, unspecified as to partial versus complete obstruction: Secondary | ICD-10-CM | POA: Diagnosis present

## 2011-09-21 DIAGNOSIS — K560 Paralytic ileus: Secondary | ICD-10-CM | POA: Diagnosis not present

## 2011-09-21 DIAGNOSIS — F05 Delirium due to known physiological condition: Secondary | ICD-10-CM | POA: Diagnosis not present

## 2011-09-21 LAB — DIFFERENTIAL
Basophils Relative: 0 % (ref 0–1)
Eosinophils Absolute: 0 10*3/uL (ref 0.0–0.7)
Lymphs Abs: 0.7 10*3/uL (ref 0.7–4.0)
Monocytes Relative: 7 % (ref 3–12)
Neutro Abs: 13.7 10*3/uL — ABNORMAL HIGH (ref 1.7–7.7)
Neutrophils Relative %: 89 % — ABNORMAL HIGH (ref 43–77)

## 2011-09-21 LAB — CBC
Hemoglobin: 16.5 g/dL (ref 13.0–17.0)
MCH: 30.9 pg (ref 26.0–34.0)
MCHC: 33.7 g/dL (ref 30.0–36.0)
Platelets: 247 10*3/uL (ref 150–400)
RBC: 5.34 MIL/uL (ref 4.22–5.81)

## 2011-09-21 LAB — COMPREHENSIVE METABOLIC PANEL
BUN: 34 mg/dL — ABNORMAL HIGH (ref 6–23)
CO2: 25 mEq/L (ref 19–32)
Calcium: 9.1 mg/dL (ref 8.4–10.5)
Chloride: 97 mEq/L (ref 96–112)
Creatinine, Ser: 1.34 mg/dL (ref 0.50–1.35)
GFR calc Af Amer: 51 mL/min — ABNORMAL LOW (ref >90)
GFR calc non Af Amer: 44 mL/min — ABNORMAL LOW (ref >90)
Glucose, Bld: 157 mg/dL — ABNORMAL HIGH (ref 70–99)
Total Bilirubin: 0.6 mg/dL (ref 0.3–1.2)

## 2011-09-21 LAB — LACTIC ACID, PLASMA: Lactic Acid, Venous: 2.7 mmol/L — ABNORMAL HIGH (ref 0.5–2.2)

## 2011-09-21 LAB — LIPASE, BLOOD: Lipase: 9 U/L — ABNORMAL LOW (ref 11–59)

## 2011-09-21 LAB — PROCALCITONIN: Procalcitonin: 0.12 ng/mL

## 2011-09-21 LAB — SURGICAL PCR SCREEN
MRSA, PCR: NEGATIVE
Staphylococcus aureus: NEGATIVE

## 2011-09-21 MED ORDER — TRAVOPROST (BAK FREE) 0.004 % OP SOLN
1.0000 [drp] | Freq: Every day | OPHTHALMIC | Status: DC
  Administered 2011-09-21: 1 [drp] via OPHTHALMIC
  Filled 2011-09-21: qty 2.5

## 2011-09-21 MED ORDER — PANTOPRAZOLE SODIUM 40 MG IV SOLR
40.0000 mg | Freq: Every day | INTRAVENOUS | Status: DC
  Administered 2011-09-21: 40 mg via INTRAVENOUS
  Filled 2011-09-21 (×2): qty 40

## 2011-09-21 MED ORDER — HYDROMORPHONE HCL PF 1 MG/ML IJ SOLN
0.5000 mg | Freq: Once | INTRAMUSCULAR | Status: AC
  Administered 2011-09-21: 14:00:00 via INTRAVENOUS
  Filled 2011-09-21: qty 1

## 2011-09-21 MED ORDER — HYDROMORPHONE HCL PF 1 MG/ML IJ SOLN
0.5000 mg | INTRAMUSCULAR | Status: DC | PRN
  Administered 2011-09-21: 0.5 mg via INTRAVENOUS
  Filled 2011-09-21: qty 1

## 2011-09-21 MED ORDER — ONDANSETRON HCL 4 MG/2ML IJ SOLN
4.0000 mg | Freq: Once | INTRAMUSCULAR | Status: AC
  Administered 2011-09-21: 4 mg via INTRAVENOUS
  Filled 2011-09-21: qty 2

## 2011-09-21 MED ORDER — IOHEXOL 300 MG/ML  SOLN
100.0000 mL | Freq: Once | INTRAMUSCULAR | Status: AC | PRN
  Administered 2011-09-21: 100 mL via INTRAVENOUS

## 2011-09-21 MED ORDER — SODIUM CHLORIDE 0.9 % IV BOLUS (SEPSIS)
1000.0000 mL | Freq: Once | INTRAVENOUS | Status: AC
  Administered 2011-09-21: 1000 mL via INTRAVENOUS

## 2011-09-21 MED ORDER — SODIUM CHLORIDE 0.9 % IV SOLN
1.0000 g | INTRAVENOUS | Status: AC
  Administered 2011-09-22: 1 g via INTRAVENOUS
  Filled 2011-09-21: qty 1

## 2011-09-21 MED ORDER — ONDANSETRON HCL 4 MG/2ML IJ SOLN
4.0000 mg | Freq: Once | INTRAMUSCULAR | Status: AC
  Administered 2011-09-21: 4 mg via INTRAVENOUS

## 2011-09-21 MED ORDER — POTASSIUM CHLORIDE IN NACL 20-0.9 MEQ/L-% IV SOLN
INTRAVENOUS | Status: DC
  Administered 2011-09-21 – 2011-09-22 (×2): via INTRAVENOUS
  Filled 2011-09-21 (×3): qty 1000

## 2011-09-21 MED ORDER — TRAVOPROST 0.004 % OP SOLN
1.0000 [drp] | Freq: Every day | OPHTHALMIC | Status: DC
  Filled 2011-09-21 (×9): qty 0.1

## 2011-09-21 MED ORDER — ONDANSETRON HCL 4 MG/2ML IJ SOLN
4.0000 mg | Freq: Four times a day (QID) | INTRAMUSCULAR | Status: DC | PRN
  Administered 2011-09-22: 4 mg via INTRAVENOUS

## 2011-09-21 NOTE — Anesthesia Preprocedure Evaluation (Addendum)
Anesthesia Evaluation  Patient identified by MRN, date of birth, ID band Patient awake    Reviewed: Allergy & Precautions, H&P , NPO status , Patient's Chart, lab work & pertinent test results  Airway Mallampati: II TM Distance: >3 FB Neck ROM: full    Dental No notable dental hx. (+) Teeth Intact and Dental Advisory Given   Pulmonary neg pulmonary ROS, shortness of breath and with exertion,  breath sounds clear to auscultation  Pulmonary exam normal       Cardiovascular + dysrhythmias Atrial Fibrillation + pacemaker Rhythm:regular Rate:Normal  RBBB and LAFB.  Mobitz type 2 block.   Neuro/Psych negative neurological ROS  negative psych ROS   GI/Hepatic negative GI ROS, Neg liver ROS, hiatal hernia, SBO   Endo/Other  negative endocrine ROS  Renal/GU negative Renal ROS  negative genitourinary   Musculoskeletal   Abdominal   Peds  Hematology negative hematology ROS (+)   Anesthesia Other Findings   Reproductive/Obstetrics negative OB ROS                          Anesthesia Physical Anesthesia Plan  ASA: III and Emergent  Anesthesia Plan: General   Post-op Pain Management:    Induction: Intravenous  Airway Management Planned: Oral ETT  Additional Equipment:   Intra-op Plan:   Post-operative Plan: Extubation in OR  Informed Consent: I have reviewed the patients History and Physical, chart, labs and discussed the procedure including the risks, benefits and alternatives for the proposed anesthesia with the patient or authorized representative who has indicated his/her understanding and acceptance.   Dental Advisory Given  Plan Discussed with: CRNA and Surgeon  Anesthesia Plan Comments:        Anesthesia Quick Evaluation

## 2011-09-21 NOTE — ED Notes (Signed)
Pt in from home with c/o N/V/abd pain states onset last night states pt just got released from hospital June 1st for bowel obstruction

## 2011-09-21 NOTE — H&P (Signed)
Austin Pittman is an 78 y.o. male.   Chief Complaint: vomiting and abdominal pain HPI: Austin Pittman is a 49 -year-old male who was discharged from the hospital one week ago following an approximately 1 week hospitalization for small bowel obstruction. This resolved with conservative management. He did well for the first 4 or 5 days but about 36 hours ago developed progressive crampy lower abdominal pain then nausea and then last night had frequent vomiting all night with bilious and then feculent vomiting. No bowel movement since the onset. He has continued sharp and crampy pain across his lower abdomen. His only previous surgery is an open appendectomy done over 70 years ago. No fever or chills.  Past Medical History  Diagnosis Date  . Atrial tachycardia     nonsustained  . Hiatal hernia   . 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 Glaucoma   . Second degree Mobitz II AV block     s/p PPM   . Macular degeneration   . Macular degeneration 09/12/2011  . Acute confusion due to known medical condition 09/12/2011    Past Surgical History  Procedure Date  . Appendectomy   . Pacemaker insertion 11/07/08    [employer] implanted by Dr Johney Frame   Current Facility-Administered Medications  Medication Dose Route Frequency Provider Last Rate Last Dose  . HYDROmorphone (DILAUDID) injection 0.5 mg  0.5 mg Intravenous Once Gerhard Munch, MD      . iohexol (OMNIPAQUE) 300 MG/ML solution 100 mL  100 mL Intravenous Once PRN Medication Radiologist, MD   100 mL at 09/21/11 1156  . ondansetron (ZOFRAN) injection 4 mg  4 mg Intravenous Once Gerhard Munch, MD   4 mg at 09/21/11 0948  . ondansetron (ZOFRAN) injection 4 mg  4 mg Intravenous Once Gerhard Munch, MD      . sodium chloride 0.9 % bolus 1,000 mL  1,000 mL Intravenous Once Gerhard Munch, MD   1,000 mL at 09/21/11 2130   Current Outpatient Prescriptions  Medication Sig Dispense Refill  . ibuprofen (ADVIL,MOTRIN) 200 MG  tablet Take 400 mg by mouth every 6 (six) hours as needed. Austin Pittman used this medication for pain.      Marland Kitchen travoprost, benzalkonium, (TRAVATAN) 0.004 % ophthalmic solution Place 1 drop into both eyes at bedtime.          History reviewed. No pertinent family history. Social History:  reports that he quit smoking about 65 years ago. His smoking use included Cigarettes. He has a 20 pack-year smoking history. He quit smokeless tobacco use about 63 years ago. His smokeless tobacco use included Chew. He reports that he does not drink alcohol or use illicit drugs.  Allergies: No Known Allergies   (Not in a hospital admission)  Results for orders placed during the hospital encounter of 09/21/11 (from the past 48 hour(s))  COMPREHENSIVE METABOLIC PANEL     Status: Abnormal   Collection Time   09/21/11  9:48 AM      Component Value Range Comment   Sodium 138  135 - 145 (mEq/L)    Potassium 4.1  3.5 - 5.1 (mEq/L)    Chloride 97  96 - 112 (mEq/L)    CO2 25  19 - 32 (mEq/L)    Glucose, Bld 157 (*) 70 - 99 (mg/dL)    BUN 34 (*) 6 - 23 (mg/dL)    Creatinine, Ser 8.65  0.50 - 1.35 (mg/dL)    Calcium 9.1  8.4 -  10.5 (mg/dL)    Total Protein 7.6  6.0 - 8.3 (g/dL)    Albumin 3.4 (*) 3.5 - 5.2 (g/dL)    AST 19  0 - 37 (U/L)    ALT 18  0 - 53 (U/L)    Alkaline Phosphatase 86  39 - 117 (U/L)    Total Bilirubin 0.6  0.3 - 1.2 (mg/dL)    GFR calc non Af Amer 44 (*) >90 (mL/min)    GFR calc Af Amer 51 (*) >90 (mL/min)   CBC     Status: Abnormal   Collection Time   09/21/11  9:48 AM      Component Value Range Comment   WBC 15.5 (*) 4.0 - 10.5 (K/uL)    RBC 5.34  4.22 - 5.81 (MIL/uL)    Hemoglobin 16.5  13.0 - 17.0 (g/dL)    HCT 16.1  09.6 - 04.5 (%)    MCV 91.6  78.0 - 100.0 (fL)    MCH 30.9  26.0 - 34.0 (pg)    MCHC 33.7  30.0 - 36.0 (g/dL)    RDW 40.9  81.1 - 91.4 (%)    Platelets 247  150 - 400 (K/uL)   DIFFERENTIAL     Status: Abnormal   Collection Time   09/21/11  9:48 AM      Component Value  Range Comment   Neutrophils Relative 89 (*) 43 - 77 (%)    Neutro Abs 13.7 (*) 1.7 - 7.7 (K/uL)    Lymphocytes Relative 5 (*) 12 - 46 (%)    Lymphs Abs 0.7  0.7 - 4.0 (K/uL)    Monocytes Relative 7  3 - 12 (%)    Monocytes Absolute 1.0  0.1 - 1.0 (K/uL)    Eosinophils Relative 0  0 - 5 (%)    Eosinophils Absolute 0.0  0.0 - 0.7 (K/uL)    Basophils Relative 0  0 - 1 (%)    Basophils Absolute 0.0  0.0 - 0.1 (K/uL)   LIPASE, BLOOD     Status: Abnormal   Collection Time   09/21/11  9:48 AM      Component Value Range Comment   Lipase 9 (*) 11 - 59 (U/L)   LACTIC ACID, PLASMA     Status: Abnormal   Collection Time   09/21/11  9:48 AM      Component Value Range Comment   Lactic Acid, Venous 2.7 (*) 0.5 - 2.2 (mmol/L)   PROCALCITONIN     Status: Normal   Collection Time   09/21/11  9:48 AM      Component Value Range Comment   Procalcitonin 0.12      Ct Abdomen Pelvis W Contrast  09/21/2011  *RADIOLOGY REPORT*  Clinical Data: Abdominal pain, possible small bowel obstruction  CT ABDOMEN AND PELVIS WITH CONTRAST  Technique:  Multidetector CT imaging of the abdomen and pelvis was performed following the standard protocol during bolus administration of intravenous contrast.  Contrast: OMNIPAQUE IOHEXOL 300 MG/ML  SOLN  Comparison: 09/08/2011  Findings: Sagittal images of the spine shows multilevel degenerative changes lumbar spine.  Lung bases shows bilateral posterior dependent atelectasis.  A small hiatal hernia is noted.  Fatty infiltration of the liver.  Calcified granuloma within spleen again noted.  Multiple large calcified gallstones are again noted within gallbladder without gallbladder distention the largest gallstone measures 1.5 cm.  No thickening of the gallbladder wall. Small hepatic cysts in the caudate lobe of the liver are stable.  Atherosclerotic calcifications and plaques within abdominal aorta and common iliac arteries again noted.  No aortic aneurysm.  Again noted mild renal  atrophy.  No hydronephrosis or hydroureter.  Stable cyst in the lower pole of the right kidney measures 1.6 cm.  There are distended multiple small bowel loops with fluid contrast and air fluid levels.  The terminal ileum is smaller caliber. Again noted focal narrowing of the small bowel caliber at the level distal ileum see axial image 77.  Again noted mild enhancement of the small bowel wall at the level of transition in the terminal ileum see axial image 76. In axial image 73 there is mild thickening of the small bowel wall in the distal ileum.  This may be due to edema. Neoplastic process cannot be excluded.  Findings are consistent with small bowel obstruction.  No any contrast material noted within the right colon.  Small amount of free fluid is noted in the right posterior pelvis just anterior to common iliac artery.  Multiple sigmoid colon diverticula are noted without evidence of acute diverticulitis.  Diverticula are noted descending colon without evidence of acute diverticulitis.  The right colon is empty collapsed.  Again noted right inguinal hernia containing fat and fluid without change from prior exam.  No pelvic free air.  IMPRESSION:  1.  Again noted changes of small bowel obstruction with transition zone in the region of the distal ileum.  There is mild enhancement and thickening of the wall of the ileum with narrowing of the lumen.  Again findings may be due to mass, inflammatory stricture or adhesion. 2.  Cholelithiasis again noted. 3.  Stable small right inguinal hernia containing fat and fluid. 4.  Degenerative changes lumbar spine again noted.  Original Report Authenticated By: Natasha Mead, M.D.    Review of Systems  Constitutional: Positive for malaise/fatigue. Negative for fever and chills.  HENT: Positive for hearing loss and sore throat.   Respiratory: Negative for cough and shortness of breath.   Cardiovascular: Negative for chest pain, palpitations and leg swelling.    Gastrointestinal: Positive for nausea, vomiting and abdominal pain.  Genitourinary: Negative.   Musculoskeletal: Positive for joint pain.  Neurological: Positive for weakness.  Psychiatric/Behavioral: Positive for memory loss.    Blood pressure 107/61, pulse 72, temperature 98.3 F (36.8 C), temperature source Oral, resp. rate 24, SpO2 99.00%. Physical Exam  Gen. Elderly but well-developed alert Caucasian male who appears somewhat younger than his stated age Skin: Pale, no rash or infection Lymph nodes: No cervical, supraclavicular or inguinal nodes palpable HEENT: No palpable masses or thyromegaly. Sclera nonicteric. Oropharynx clear. He is hard of hearing. Lungs: Clear bilaterally without wheezing or increased work of breathing Cardiovascular: Regular rate and rhythm. No murmur. No peripheral edema. Abdomen: Healed right lower quadrant incision without hernias. Distended. There is marked borborygmi. No significant tenderness. No masses or organomegaly appreciated. Extremities: No joint swelling or deformity or edema Neurologic: He is alert and conversant and fully oriented. Mood affect normal. No gross motor deficits.  Assessment/Plan Recurrent/persistent small bowel obstruction. CT scan indicates an area of thickening and narrowing in the terminal ileum. Consistent with possibly adhesions versus tumor. He has failed a concerted attempt at nonoperative management and I believe he will need surgical intervention. He and his family are in agreement. The Austin Pittman will be admitted, NG tube placed, and IV hydration administered. We will plan laparoscopic exploration with possible open surgery. I discussed the indications various possible findings and surgical procedures with the  Austin Pittman and family.  Lugenia Assefa T 09/21/2011, 1:23 PM

## 2011-09-21 NOTE — ED Provider Notes (Signed)
History     CSN: [id number]  Arrival date & time 09/21/11  7829   First MD Initiated Contact with Patient 09/21/11 [phone]      Chief Complaint  Patient presents with  . Nausea  . Abdominal Pain  . Emesis     HPI  the patient presents with abdominal pain, nausea, vomiting. Notably, the patient was admitted 2 weeks ago to our facility following the diagnosis of small bowel obstruction.  He was hospitalized for one week, discharged in stable condition, and notes that he was well for several days.  Since these symptoms began yesterday, gradually, he has been persistent and progressive.  He notes increasing abdominal distention, increasing anorexia, multiple episodes of vomiting.  He has had bowel movements, though none since yesterday.  Denies any fevers, chills.  He does state that he is more weak than usual.  No pain relief with OTC medication. The patient has a distant abdominal surgical history of appendectomy.   Past Medical History  Diagnosis Date  . Atrial tachycardia     nonsustained  . Hiatal hernia   . 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 Glaucoma   . Second degree Mobitz II AV block     s/p PPM   . Macular degeneration   . Macular degeneration 09/12/2011  . Acute confusion due to known medical condition 09/12/2011    Past Surgical History  Procedure Date  . Appendectomy   . Pacemaker insertion 11/07/08    St Jude implanted by Dr Johney Frame    History reviewed. No pertinent family history.  History  Substance Use Topics  . Smoking status: Former Smoker -- 1.0 packs/day for 20 years    Types: Cigarettes    Quit date: 04/15/1946  . Smokeless tobacco: Former Neurosurgeon    Types: Chew    Quit date: 04/15/1948   Comment: chewed tobacco for 2 years  . Alcohol Use: No      Review of Systems  Constitutional:       Per HPI, otherwise negative  HENT:       Per HPI, otherwise negative  Eyes: Negative.   Respiratory:       Per HPI,  otherwise negative  Cardiovascular:       Per HPI, otherwise negative  Gastrointestinal: Positive for vomiting.  Genitourinary: Negative.   Musculoskeletal:       Per HPI, otherwise negative  Skin: Negative.   Neurological: Negative for syncope.    Allergies  Review of patient's allergies indicates no known allergies.  Home Medications   Current Outpatient Rx  Name Route Sig Dispense Refill  . IBUPROFEN 200 MG PO TABS Oral Take 400 mg by mouth every 6 (six) hours as needed. Patient used this medication for pain.    . TRAVOPROST 0.004 % OP SOLN Both Eyes Place 1 drop into both eyes at bedtime.       BP 138/70  Pulse 108  Temp(Src) 98.1 F (36.7 C) (Oral)  Resp 20  SpO2 95%  Physical Exam  Nursing note and vitals reviewed. Constitutional: He is oriented to person, place, and time. He appears well-developed and well-nourished. No distress.       Uncomfortable appearance.   HENT:  Head: Normocephalic and atraumatic.  Eyes: EOM are normal. Pupils are equal, round, and reactive to light.  Neck: Normal range of motion. Neck supple. No tracheal deviation present.  Cardiovascular: Regular rhythm.  Tachycardia present.   Murmur heard. Pulmonary/Chest: Effort  normal and breath sounds normal. No respiratory distress.  Abdominal: Soft. He exhibits distension (mildly). There is tenderness.       Periumbilical tenderness.   Musculoskeletal: Normal range of motion. He exhibits no edema.  Neurological: He is alert and oriented to person, place, and time. No sensory deficit.  Skin: Skin is warm and dry.  Psychiatric: He has a normal mood and affect. His behavior is normal.    ED Course  Procedures (including critical care time)   Labs Reviewed  COMPREHENSIVE METABOLIC PANEL  CBC  DIFFERENTIAL  LIPASE, BLOOD  LACTIC ACID, PLASMA  PROCALCITONIN  CT reviewed No results found.   No diagnosis found.   Prior hospital notes reviewed, including imaging studies.     I  discussed the case with Dr. Odie Sera, surgeon on call. MDM  This elderly male presents with nausea, vomiting, abdominal pain and distention.  On exam the patient is uncomfortable appearing with mild tachycardia and an abdomen that is tender to palpation.  A review of the patient's prior notes demonstrates a similar presentation 2 weeks ago that resulted in admission with diagnosis of SBO.  Today's presentation is concerning for recurrence of this phenomena.  The patient's labs are notable for mild leukocytosis and lactic acidosis.  The patient's CT demonstrates a small bowel obstruction, with a transition point near the distal ileum.  I discussed the range of possible causes, adhesions versus malignancy versus infectious with the patient and his family.  Advised the patient had an NG tube placed for comfort measures, he refused this suggestion, but requested pain medication.  The patient was admitted to the surgical service for further evaluation and management.  Gerhard Munch, MD 09/21/11 [phone]

## 2011-09-22 ENCOUNTER — Encounter (HOSPITAL_COMMUNITY): Admission: EM | Disposition: A | Discharge status: Skilled Nursing Facility | Payer: Self-pay | Source: Home / Self Care

## 2011-09-22 ENCOUNTER — Encounter (HOSPITAL_COMMUNITY): Payer: Self-pay | Admitting: Anesthesiology

## 2011-09-22 ENCOUNTER — Inpatient Hospital Stay (HOSPITAL_COMMUNITY): Payer: Medicare Other | Admitting: Anesthesiology

## 2011-09-22 DIAGNOSIS — D375 Neoplasm of uncertain behavior of rectum: Secondary | ICD-10-CM

## 2011-09-22 DIAGNOSIS — D371 Neoplasm of uncertain behavior of stomach: Secondary | ICD-10-CM

## 2011-09-22 DIAGNOSIS — D378 Neoplasm of uncertain behavior of other specified digestive organs: Secondary | ICD-10-CM

## 2011-09-22 HISTORY — PX: LAPAROSCOPY: SHX197

## 2011-09-22 LAB — BASIC METABOLIC PANEL
BUN: 29 mg/dL — ABNORMAL HIGH (ref 6–23)
CO2: 27 mEq/L (ref 19–32)
Calcium: 7.7 mg/dL — ABNORMAL LOW (ref 8.4–10.5)
GFR calc non Af Amer: 44 mL/min — ABNORMAL LOW (ref >90)
Glucose, Bld: 104 mg/dL — ABNORMAL HIGH (ref 70–99)

## 2011-09-22 LAB — CBC
MCH: 30.5 pg (ref 26.0–34.0)
MCHC: 32.6 g/dL (ref 30.0–36.0)
MCV: 93.7 fL (ref 78.0–100.0)
Platelets: 172 10*3/uL (ref 150–400)

## 2011-09-22 SURGERY — LAPAROSCOPY, DIAGNOSTIC
Anesthesia: General | Site: Abdomen | Wound class: Clean Contaminated

## 2011-09-22 MED ORDER — SUCCINYLCHOLINE CHLORIDE 20 MG/ML IJ SOLN
INTRAMUSCULAR | Status: DC | PRN
  Administered 2011-09-22: 100 mg via INTRAVENOUS

## 2011-09-22 MED ORDER — ONDANSETRON HCL 4 MG PO TABS: 4.0000 mg | ORAL_TABLET | Freq: Four times a day (QID) | ORAL | Status: DC | PRN

## 2011-09-22 MED ORDER — HALOPERIDOL LACTATE 5 MG/ML IJ SOLN
2.0000 mg | Freq: Four times a day (QID) | INTRAMUSCULAR | Status: DC | PRN
  Administered 2011-09-24: 5 mg via INTRAVENOUS
  Administered 2011-09-25: 3 mg via INTRAVENOUS
  Filled 2011-09-22 (×2): qty 1

## 2011-09-22 MED ORDER — NEOSTIGMINE METHYLSULFATE 1 MG/ML IJ SOLN
INTRAMUSCULAR | Status: DC | PRN
  Administered 2011-09-22: 5 mg via INTRAVENOUS

## 2011-09-22 MED ORDER — HETASTARCH-ELECTROLYTES 6 % IV SOLN
INTRAVENOUS | Status: DC | PRN
  Administered 2011-09-22: 08:00:00 via INTRAVENOUS

## 2011-09-22 MED ORDER — LACTATED RINGERS IV SOLN
INTRAVENOUS | Status: DC | PRN
  Administered 2011-09-22: 08:00:00 via INTRAVENOUS

## 2011-09-22 MED ORDER — GLYCOPYRROLATE 0.2 MG/ML IJ SOLN
INTRAMUSCULAR | Status: DC | PRN
  Administered 2011-09-22: .7 mg via INTRAVENOUS

## 2011-09-22 MED ORDER — FENTANYL CITRATE 0.05 MG/ML IJ SOLN
INTRAMUSCULAR | Status: DC | PRN
  Administered 2011-09-22 (×3): 50 ug via INTRAVENOUS
  Administered 2011-09-22 (×2): 25 ug via INTRAVENOUS
  Administered 2011-09-22: 50 ug via INTRAVENOUS

## 2011-09-22 MED ORDER — PHENYLEPHRINE HCL 10 MG/ML IJ SOLN
INTRAMUSCULAR | Status: DC | PRN
  Administered 2011-09-22 (×2): 40 ug via INTRAVENOUS

## 2011-09-22 MED ORDER — ETOMIDATE 2 MG/ML IV SOLN
INTRAVENOUS | Status: DC | PRN
  Administered 2011-09-22: 12 mg via INTRAVENOUS

## 2011-09-22 MED ORDER — BUPIVACAINE-EPINEPHRINE 0.5% -1:200000 IJ SOLN
INTRAMUSCULAR | Status: DC | PRN
  Administered 2011-09-22: 30 mL

## 2011-09-22 MED ORDER — KCL IN DEXTROSE-NACL 20-5-0.9 MEQ/L-%-% IV SOLN
INTRAVENOUS | Status: DC
  Administered 2011-09-22 – 2011-09-25 (×7): via INTRAVENOUS
  Filled 2011-09-22 (×9): qty 1000

## 2011-09-22 MED ORDER — HEPARIN SODIUM (PORCINE) 5000 UNIT/ML IJ SOLN
5000.0000 [IU] | Freq: Three times a day (TID) | INTRAMUSCULAR | Status: DC
  Administered 2011-09-23 – 2011-09-27 (×12): 5000 [IU] via SUBCUTANEOUS
  Filled 2011-09-22 (×16): qty 1

## 2011-09-22 MED ORDER — ONDANSETRON HCL 4 MG/2ML IJ SOLN
INTRAMUSCULAR | Status: DC | PRN
  Administered 2011-09-22: 4 mg via INTRAVENOUS

## 2011-09-22 MED ORDER — LACTATED RINGERS IR SOLN
Status: DC | PRN
  Administered 2011-09-22: 1000 mL

## 2011-09-22 MED ORDER — ROCURONIUM BROMIDE 100 MG/10ML IV SOLN
INTRAVENOUS | Status: DC | PRN
  Administered 2011-09-22: 15 mg via INTRAVENOUS
  Administered 2011-09-22: 35 mg via INTRAVENOUS
  Administered 2011-09-22 (×3): 5 mg via INTRAVENOUS
  Administered 2011-09-22: 2.5 mg via INTRAVENOUS

## 2011-09-22 MED ORDER — LIDOCAINE HCL (CARDIAC) 20 MG/ML IV SOLN
INTRAVENOUS | Status: DC | PRN
  Administered 2011-09-22: 100 mg via INTRAVENOUS

## 2011-09-22 MED ORDER — ACETAMINOPHEN 10 MG/ML IV SOLN
INTRAVENOUS | Status: DC | PRN
  Administered 2011-09-22: 1000 mg via INTRAVENOUS

## 2011-09-22 MED ORDER — KCL IN DEXTROSE-NACL 20-5-0.9 MEQ/L-%-% IV SOLN
INTRAVENOUS | Status: AC
  Filled 2011-09-22: qty 1000

## 2011-09-22 MED ORDER — HYDROMORPHONE HCL PF 1 MG/ML IJ SOLN
0.5000 mg | INTRAMUSCULAR | Status: DC | PRN
  Administered 2011-09-22 – 2011-09-25 (×7): 1 mg via INTRAVENOUS
  Filled 2011-09-22 (×7): qty 1

## 2011-09-22 MED ORDER — LORAZEPAM 2 MG/ML IJ SOLN
0.5000 mg | Freq: Three times a day (TID) | INTRAMUSCULAR | Status: DC | PRN
  Administered 2011-09-24: 0.5 mg via INTRAVENOUS
  Filled 2011-09-22: qty 1

## 2011-09-22 MED ORDER — HYDROMORPHONE HCL PF 1 MG/ML IJ SOLN
INTRAMUSCULAR | Status: DC | PRN
  Administered 2011-09-22 (×2): 0.5 mg via INTRAVENOUS
  Administered 2011-09-22: 1 mg via INTRAVENOUS

## 2011-09-22 MED ORDER — LACTATED RINGERS IV SOLN: INTRAVENOUS | Status: DC

## 2011-09-22 MED ORDER — HYDROMORPHONE HCL PF 1 MG/ML IJ SOLN: 0.2500 mg | INTRAMUSCULAR | Status: DC | PRN

## 2011-09-22 MED ORDER — TRAVOPROST (BAK FREE) 0.004 % OP SOLN
1.0000 [drp] | Freq: Every day | OPHTHALMIC | Status: DC
  Administered 2011-09-22 – 2011-09-26 (×5): 1 [drp] via OPHTHALMIC
  Filled 2011-09-22: qty 2.5

## 2011-09-22 MED ORDER — 0.9 % SODIUM CHLORIDE (POUR BTL) OPTIME
TOPICAL | Status: DC | PRN
  Administered 2011-09-22: 1000 mL

## 2011-09-22 MED ORDER — ONDANSETRON HCL 4 MG/2ML IJ SOLN
4.0000 mg | Freq: Four times a day (QID) | INTRAMUSCULAR | Status: DC | PRN
  Administered 2011-09-23: 4 mg via INTRAVENOUS
  Filled 2011-09-22: qty 2

## 2011-09-22 SURGICAL SUPPLY — 61 items
APPLICATOR COTTON TIP 6IN STRL (MISCELLANEOUS) ×3 IMPLANT
BENZOIN TINCTURE PRP APPL 2/3 (GAUZE/BANDAGES/DRESSINGS) IMPLANT
BLADE EXTENDED COATED 6.5IN (ELECTRODE) IMPLANT
BLADE HEX COATED 2.75 (ELECTRODE) ×3 IMPLANT
CABLE HI FREQUENCY MONOPOLAR (ELECTROSURGICAL) ×3 IMPLANT
CANISTER SUCTION 2500CC (MISCELLANEOUS) ×3 IMPLANT
CANNULA ENDOPATH XCEL 11M (ENDOMECHANICALS) IMPLANT
CELLS DAT CNTRL 66122 CELL SVR (MISCELLANEOUS) ×2 IMPLANT
CLOTH BEACON ORANGE TIMEOUT ST (SAFETY) ×3 IMPLANT
COVER MAYO STAND STRL (DRAPES) ×3 IMPLANT
DECANTER SPIKE VIAL GLASS SM (MISCELLANEOUS) IMPLANT
DERMABOND ADVANCED (GAUZE/BANDAGES/DRESSINGS) ×1
DERMABOND ADVANCED .7 DNX12 (GAUZE/BANDAGES/DRESSINGS) ×2 IMPLANT
DRAPE LAPAROSCOPIC ABDOMINAL (DRAPES) ×3 IMPLANT
DRAPE WARM FLUID 44X44 (DRAPE) ×3 IMPLANT
ELECT REM PT RETURN 9FT ADLT (ELECTROSURGICAL) ×3
ELECTRODE REM PT RTRN 9FT ADLT (ELECTROSURGICAL) ×2 IMPLANT
GLOVE BIOGEL PI IND STRL 7.0 (GLOVE) ×2 IMPLANT
GLOVE BIOGEL PI INDICATOR 7.0 (GLOVE) ×1
GOWN STRL NON-REIN LRG LVL3 (GOWN DISPOSABLE) ×3 IMPLANT
GOWN STRL REIN XL XLG (GOWN DISPOSABLE) ×6 IMPLANT
KIT BASIN OR (CUSTOM PROCEDURE TRAY) ×3 IMPLANT
LIGASURE IMPACT 36 18CM CVD LR (INSTRUMENTS) ×3 IMPLANT
NS IRRIG 1000ML POUR BTL (IV SOLUTION) ×3 IMPLANT
PACK GENERAL/GYN (CUSTOM PROCEDURE TRAY) ×3 IMPLANT
RELOAD PROXIMATE 75MM BLUE (ENDOMECHANICALS) ×12 IMPLANT
RTRCTR WOUND ALEXIS 18CM MED (MISCELLANEOUS) ×3
SCALPEL HARMONIC ACE (MISCELLANEOUS) ×3 IMPLANT
SCISSORS MNPLR CVD DVNC (INSTRUMENTS) ×2 IMPLANT
SCISSORS MONOPOLAR CVD (INSTRUMENTS) ×1
SET IRRIG TUBING LAPAROSCOPIC (IRRIGATION / IRRIGATOR) ×3 IMPLANT
SLEEVE SURGEON STRL (DRAPES) ×3 IMPLANT
SOLUTION ANTI FOG 6CC (MISCELLANEOUS) ×3 IMPLANT
SPONGE GAUZE 4X4 12PLY (GAUZE/BANDAGES/DRESSINGS) ×3 IMPLANT
SPONGE LAP 18X18 X RAY DECT (DISPOSABLE) ×3 IMPLANT
STAPLER GUN LINEAR PROX 60 (STAPLE) ×3 IMPLANT
STAPLER PROXIMATE 75MM BLUE (STAPLE) ×3 IMPLANT
STAPLER VISISTAT 35W (STAPLE) ×3 IMPLANT
STRIP CLOSURE SKIN 1/2X4 (GAUZE/BANDAGES/DRESSINGS) IMPLANT
SUCTION POOLE TIP (SUCTIONS) ×3 IMPLANT
SUT MNCRL AB 4-0 PS2 18 (SUTURE) ×3 IMPLANT
SUT PDS AB 1 CT1 27 (SUTURE) ×3 IMPLANT
SUT PDS AB 1 CTX 36 (SUTURE) ×6 IMPLANT
SUT PDS AB 1 CTXB1 36 (SUTURE) ×3 IMPLANT
SUT SILK 2 0 (SUTURE) ×1
SUT SILK 2 0 SH CR/8 (SUTURE) ×6 IMPLANT
SUT SILK 2-0 18XBRD TIE 12 (SUTURE) ×2 IMPLANT
SUT SILK 3 0 (SUTURE)
SUT SILK 3 0 SH CR/8 (SUTURE) ×3 IMPLANT
SUT SILK 3-0 18XBRD TIE 12 (SUTURE) IMPLANT
SUT VIC AB 4-0 PS2 27 (SUTURE) IMPLANT
SYS LAPSCP GELPORT 120MM (MISCELLANEOUS) ×3
SYSTEM LAPSCP GELPORT 120MM (MISCELLANEOUS) ×2 IMPLANT
TOWEL OR 17X26 10 PK STRL BLUE (TOWEL DISPOSABLE) ×6 IMPLANT
TRAY FOLEY CATH 14FRSI W/METER (CATHETERS) ×3 IMPLANT
TRAY LAP CHOLE (CUSTOM PROCEDURE TRAY) ×3 IMPLANT
TROCAR BLADELESS OPT 5 75 (ENDOMECHANICALS) ×12 IMPLANT
TROCAR XCEL BLUNT TIP 100MML (ENDOMECHANICALS) ×3 IMPLANT
TROCAR XCEL NON-BLD 11X100MML (ENDOMECHANICALS) IMPLANT
TUBING INSUFFLATION 10FT LAP (TUBING) ×3 IMPLANT
YANKAUER SUCT BULB TIP NO VENT (SUCTIONS) ×3 IMPLANT

## 2011-09-22 NOTE — Anesthesia Postprocedure Evaluation (Signed)
  Anesthesia Post-op Note  Patient: Austin Pittman  Procedure(s) Performed: Procedure(s) (LRB): LAPAROSCOPY DIAGNOSTIC (N/A)  Patient Location: PACU  Anesthesia Type: General  Level of Consciousness: awake and alert   Airway and Oxygen Therapy: Patient Spontanous Breathing  Post-op Pain: mild  Post-op Assessment: Post-op Vital signs reviewed, Patient's Cardiovascular Status Stable, Respiratory Function Stable, Patent Airway and No signs of Nausea or vomiting  Post-op Vital Signs: stable  Complications: No apparent anesthesia complications

## 2011-09-22 NOTE — Op Note (Signed)
Preoperative Diagnosis: SBO (small bowel obstruction) [560.9] EMESIS; ABDOMINAL PAIN  Postoprative Diagnosis: SBO (small bowel obstruction) [560.9] EMESIS; ABDOMINAL PAIN  Procedure: Procedure(s): LAPAROSCOPIC ILEOCECECTOMY   Surgeon: Mariella Saa   Assistants: Estelle Grumbles  Anesthesia:  General endotracheal anesthesia  Indications:   Patient is a 78 year old male who was admitted approximately 2 weeks ago with small bowel obstruction that resolved with conservative management. He however has been home less than a week when he developed recurrent abdominal distention pain and frequent nausea and vomiting. CT scan this admission has shown high-grade distal small bowel obstruction with a distinctly abnormal thickened loop of terminal ileum consistent with inflammation or possibly neoplasm. With this history and findings after discussion with the patient and family we have elected to proceed with laparoscopic exploration and possible resection. I discussed the indications for the procedure, alternatives, risks of anesthetic complications, bleeding, infection, anastomotic leak. They understand and agree.  Procedure Detail:  Patient was brought to the operating room, placed in the supine position on the operating table, and general endotracheal anesthesia induced. Foley catheter was placed. NG tube was in place. The abdomen was widely sterilely prepped and draped. He received preoperative broad-spectrum IV antibiotics. Patient timeout was performed the correct procedure verified. Access was obtained with a 5 mm Optiview trocar in the right upper quadrant without difficulty and pneumoperitoneum established. There was no evidence of trocar injury. Under direct vision 5 mm trochars were placed in the midline just above the umbilicus and in the left lower quadrant. There were multiple dilated loops of proximal small bowel but visualization was adequate. As we traced the bowel distally there was a  distinctly thickened and inflamed segment of terminal ileum in the right lower quadrant. There were adhesions to this area from the patient's previous open appendectomy. These adhesions were lysed from the anterior abdominal wall and lateral dome of the wall completely mobilizing the terminal ileum at the ileocecal valve. The last few centimeters of terminal ileum appeared normal but the area proximal to this over about 10-12 cm was markedly thickened and clearly the source of obstruction. The adhesions were not the source of obstruction. There was some creeping fat as well and the appearance seemed most consistent with Crohn's disease. I elected to proceed with resection. The terminal ileum and cecum were mobilized dividing the lateral peritoneal attachments and dividing the peritoneum along the base of the mesentery of the terminal ileum over toward the midline. Using mostly blunt dissection the cecum and proximal right colon and terminal ileum were mobilized from lateral to medial. The right ureter was identified and protected. When we felt we had enough mobilization I made approximately 5 cm incision in the midline periumbilical and with the wound protector the specimen up into the wound. The mesentery and bowel however were so thick that I had to enlarge this to about 7 cm. The small bowel could then be eviscerated. The small bowel was divided proximal to the area of thickening and inflammation with the GIA 75 mm stapler. The mesentery of the involved segment and cecum were then divided with the LigaSure. The mesentery was quite thick and there were a couple of bleeding points which were oversewn with 2-0 silk. I then dissected mesentery away from the proximal right colon which was divided with another firing of the GIA 75 mm stapler. On exposing the right colon for the anastomosis however I at this point did not feel that I had adequate mobility and visualization to perform the  anastomosis. I therefore placed  a GelPort and through the same 7 cm midline incision using hand assist we further and extensively mobilized the proximal right colon and the hepatic flexure using Harmonic Scalpel and rotated the mesentery over to the midline. The duodenum was identified and protected. There were some adhesions to the edge of the liver that were taken down which provided further mobility. The bowel then was again exteriorized at this point we had plenty of mobility. We did noted this point about 7 or 8 cm proximal to the division of the small bowel there was a Meckel's diverticulum. We elected to resect this by taking her small bowel division a little more proximal which was done with the GIA 75 mm stapler and the mesentery of this segment was divided with the LigaSure and the specimen removed. Following this a functional end-to-end anastomosis was created between the ileum and the right colon with a single firing of the GIA 75 mm stapler. The staple line was intact without bleeding. The common enterotomy was closed with a single firing of the TA 60 stapler. The anastomosis was airtight with good blood supply widely patent and under no tension. The crotch of the anastomosis and staple lines were reinforced with interrupted 2-0 silk. The viscera were returned to the abdomen. We carefully inspected the abdomen laparoscopically and thoroughly urinated the entire abdomen. There was no evidence of bleeding or trocar injury or other problem. All CO2 was evacuated and the wound protector removed the gloves changed. The soft tissue was infiltrated with Marcaine. The midline fascia was closed with running #1 PDS beginning at either end the incision and tied centrally. The subcutaneous tissue was irrigated and the skin closed with staples. Sponge needle and instrument counts were correct.  Findings: See above  Estimated Blood Loss:  200 mL         Drains: none  Blood Given: none          Specimens: terminal ileum and cecum          Complications:  * No complications entered in OR log *         Disposition: PACU - hemodynamically stable.         Condition: stable      Mariella Saa MD, FACS  09/22/2011, 11:16 AM

## 2011-09-22 NOTE — Progress Notes (Signed)
Pt sent to surgery. Annitta Needs, RN

## 2011-09-22 NOTE — Transfer of Care (Signed)
Immediate Anesthesia Transfer of Care Note  Patient: Austin Pittman  Procedure(s) Performed: Procedure(s) (LRB): LAPAROSCOPY DIAGNOSTIC (N/A)  Patient Location: PACU  Anesthesia Type: General  Level of Consciousness: sedated, patient cooperative and responds to stimulaton  Airway & Oxygen Therapy: Patient Spontanous Breathing and Patient connected to face mask oxgen  Post-op Assessment: Report given to PACU RN and Post -op Vital signs reviewed and stable  Post vital signs: Reviewed and stable  Complications: No apparent anesthesia complications

## 2011-09-23 ENCOUNTER — Encounter (HOSPITAL_COMMUNITY): Payer: Self-pay

## 2011-09-23 DIAGNOSIS — K56699 Other intestinal obstruction unspecified as to partial versus complete obstruction: Secondary | ICD-10-CM | POA: Diagnosis present

## 2011-09-23 LAB — CBC
Hemoglobin: 11.9 g/dL — ABNORMAL LOW (ref 13.0–17.0)
MCH: 30.1 pg (ref 26.0–34.0)
MCHC: 31.8 g/dL (ref 30.0–36.0)
MCV: 94.4 fL (ref 78.0–100.0)
Platelets: 161 10*3/uL (ref 150–400)
RBC: 3.96 MIL/uL — ABNORMAL LOW (ref 4.22–5.81)

## 2011-09-23 LAB — BASIC METABOLIC PANEL
BUN: 18 mg/dL (ref 6–23)
CO2: 26 mEq/L (ref 19–32)
Calcium: 7.7 mg/dL — ABNORMAL LOW (ref 8.4–10.5)
GFR calc non Af Amer: 56 mL/min — ABNORMAL LOW (ref >90)
Glucose, Bld: 156 mg/dL — ABNORMAL HIGH (ref 70–99)
Sodium: 135 mEq/L (ref 135–145)

## 2011-09-23 MED ORDER — PHENOL 1.4 % MT LIQD: 2.0000 | OROMUCOSAL | Status: DC | PRN

## 2011-09-23 MED ORDER — LACTATED RINGERS IV BOLUS (SEPSIS): 1000.0000 mL | Freq: Three times a day (TID) | INTRAVENOUS | Status: AC | PRN

## 2011-09-23 MED ORDER — PROMETHAZINE HCL 25 MG/ML IJ SOLN: 12.5000 mg | Freq: Four times a day (QID) | INTRAMUSCULAR | Status: DC | PRN

## 2011-09-23 MED ORDER — MAGIC MOUTHWASH
15.0000 mL | Freq: Four times a day (QID) | ORAL | Status: DC | PRN
  Filled 2011-09-23: qty 15

## 2011-09-23 MED ORDER — BISACODYL 10 MG RE SUPP: 10.0000 mg | Freq: Two times a day (BID) | RECTAL | Status: DC | PRN

## 2011-09-23 MED ORDER — LIP MEDEX EX OINT
1.0000 "application " | TOPICAL_OINTMENT | Freq: Two times a day (BID) | CUTANEOUS | Status: DC
  Administered 2011-09-24 – 2011-09-26 (×3): 1 via TOPICAL
  Filled 2011-09-23 (×2): qty 7

## 2011-09-23 MED ORDER — MENTHOL 3 MG MT LOZG
1.0000 | LOZENGE | OROMUCOSAL | Status: DC | PRN
  Filled 2011-09-23: qty 9

## 2011-09-23 NOTE — Progress Notes (Signed)
Austin Pittman [id number] 04/03/1916  CARE TEAM:  PCP: Josue Hector, MD, MD  Outpatient Care Team: Patient Care Team: Josue Hector, MD as PCP - General (Family Medicine)  Inpatient Treatment Team: Treatment Team: Attending Provider: Bishop Limbo, MD; Technician: Justice Britain, NT; Registered Nurse: Guy Franco, RN; Rounding Team: Bishop Limbo, MD; Registered Nurse: Porfirio Oar, RN; Technician: Koleen Distance, [place]; Registered Nurse: Sanjuana Letters, RN  Subjective:  Sore but medicines help No events ICU RN in room  Objective:  Vital signs:  Filed Vitals:   09/23/11 0000 09/23/11 0400 09/23/11 0700 09/23/11 0800  BP:      Pulse:   88 87  Temp: 98.4 F (36.9 C) 99.1 F (37.3 C)    TempSrc: Oral Oral    Resp:   3 19  Height:      Weight:      SpO2:   93% 96%    Last BM Date: 09/22/11  Intake/Output   Yesterday:  06/09 0701 - 06/10 0700 In: 5825 [I.V.:4700; IV Piggyback:500] Out: 1680 [Urine:560; Emesis/NG output:970; Blood:150] This shift:     Bowel function:  Flatus: n  BM: n  NGT thick bilious.  I flushed & is patent  Physical Exam:  General: Pt awake/alert/oriented x4 in no acute distress Eyes: PERRL, normal EOM.  Sclera clear.  No icterus Neuro: CN II-XII intact w/o focal sensory/motor deficits. Lymph: No head/neck/groin lymphadenopathy Psych:  No delerium/psychosis/paranoia.  Pleasant, chatty HENT: Normocephalic, Mucus membranes moist.  No thrush Neck: Supple, No tracheal deviation Chest: No chest wall pain w good excursion CV:  Pulses intact.  Regular rhythm Abdomen: Soft.  Mildly distended.  Mildly tender at incisions only.  No incarcerated hernias. Ext:  SCDs BLE.  No mjr edema.  No cyanosis Skin: No petechiae / purpurae  Results:   Labs: Results for orders placed during the hospital encounter of 09/21/11 (from the past 48 hour(s))  COMPREHENSIVE METABOLIC PANEL     Status: Abnormal   Collection Time   09/21/11  9:48 AM   Component Value Range Comment   Sodium 138  135 - 145 (mEq/L)    Potassium 4.1  3.5 - 5.1 (mEq/L)    Chloride 97  96 - 112 (mEq/L)    CO2 25  19 - 32 (mEq/L)    Glucose, Bld 157 (*) 70 - 99 (mg/dL)    BUN 34 (*) 6 - 23 (mg/dL)    Creatinine, Ser 0.10  0.50 - 1.35 (mg/dL)    Calcium 9.1  8.4 - 10.5 (mg/dL)    Total Protein 7.6  6.0 - 8.3 (g/dL)    Albumin 3.4 (*) 3.5 - 5.2 (g/dL)    AST 19  0 - 37 (U/L)    ALT 18  0 - 53 (U/L)    Alkaline Phosphatase 86  39 - 117 (U/L)    Total Bilirubin 0.6  0.3 - 1.2 (mg/dL)    GFR calc non Af Amer 44 (*) >90 (mL/min)    GFR calc Af Amer 51 (*) >90 (mL/min)   CBC     Status: Abnormal   Collection Time   09/21/11  9:48 AM      Component Value Range Comment   WBC 15.5 (*) 4.0 - 10.5 (K/uL)    RBC 5.34  4.22 - 5.81 (MIL/uL)    Hemoglobin 16.5  13.0 - 17.0 (g/dL)    HCT 93.2  35.5 - 73.2 (%)    MCV 91.6  78.0 - 100.0 (  fL)    MCH 30.9  26.0 - 34.0 (pg)    MCHC 33.7  30.0 - 36.0 (g/dL)    RDW 21.3  08.6 - 57.8 (%)    Platelets 247  150 - 400 (K/uL)   DIFFERENTIAL     Status: Abnormal   Collection Time   09/21/11  9:48 AM      Component Value Range Comment   Neutrophils Relative 89 (*) 43 - 77 (%)    Neutro Abs 13.7 (*) 1.7 - 7.7 (K/uL)    Lymphocytes Relative 5 (*) 12 - 46 (%)    Lymphs Abs 0.7  0.7 - 4.0 (K/uL)    Monocytes Relative 7  3 - 12 (%)    Monocytes Absolute 1.0  0.1 - 1.0 (K/uL)    Eosinophils Relative 0  0 - 5 (%)    Eosinophils Absolute 0.0  0.0 - 0.7 (K/uL)    Basophils Relative 0  0 - 1 (%)    Basophils Absolute 0.0  0.0 - 0.1 (K/uL)   LIPASE, BLOOD     Status: Abnormal   Collection Time   09/21/11  9:48 AM      Component Value Range Comment   Lipase 9 (*) 11 - 59 (U/L)   LACTIC ACID, PLASMA     Status: Abnormal   Collection Time   09/21/11  9:48 AM      Component Value Range Comment   Lactic Acid, Venous 2.7 (*) 0.5 - 2.2 (mmol/L)   PROCALCITONIN     Status: Normal   Collection Time   09/21/11  9:48 AM      Component Value  Range Comment   Procalcitonin 0.12     SURGICAL PCR SCREEN     Status: Normal   Collection Time   09/21/11  4:58 PM      Component Value Range Comment   MRSA, PCR NEGATIVE  NEGATIVE     Staphylococcus aureus NEGATIVE  NEGATIVE    BASIC METABOLIC PANEL     Status: Abnormal   Collection Time   09/22/11  4:51 AM      Component Value Range Comment   Sodium 141  135 - 145 (mEq/L)    Potassium 3.9  3.5 - 5.1 (mEq/L)    Chloride 104  96 - 112 (mEq/L)    CO2 27  19 - 32 (mEq/L)    Glucose, Bld 104 (*) 70 - 99 (mg/dL)    BUN 29 (*) 6 - 23 (mg/dL)    Creatinine, Ser 4.69  0.50 - 1.35 (mg/dL)    Calcium 7.7 (*) 8.4 - 10.5 (mg/dL)    GFR calc non Af Amer 44 (*) >90 (mL/min)    GFR calc Af Amer 52 (*) >90 (mL/min)   CBC     Status: Normal   Collection Time   09/22/11  4:51 AM      Component Value Range Comment   WBC 8.2  4.0 - 10.5 (K/uL)    RBC 4.29  4.22 - 5.81 (MIL/uL)    Hemoglobin 13.1  13.0 - 17.0 (g/dL)    HCT 62.9  52.8 - 41.3 (%)    MCV 93.7  78.0 - 100.0 (fL)    MCH 30.5  26.0 - 34.0 (pg)    MCHC 32.6  30.0 - 36.0 (g/dL)    RDW 24.4  01.0 - 27.2 (%)    Platelets 172  150 - 400 (K/uL)   BASIC METABOLIC PANEL     Status:  Abnormal   Collection Time   09/23/11  3:10 AM      Component Value Range Comment   Sodium 135  135 - 145 (mEq/L)    Potassium 4.0  3.5 - 5.1 (mEq/L)    Chloride 101  96 - 112 (mEq/L)    CO2 26  19 - 32 (mEq/L)    Glucose, Bld 156 (*) 70 - 99 (mg/dL)    BUN 18  6 - 23 (mg/dL)    Creatinine, Ser 1.61  0.50 - 1.35 (mg/dL)    Calcium 7.7 (*) 8.4 - 10.5 (mg/dL)    GFR calc non Af Amer 56 (*) >90 (mL/min)    GFR calc Af Amer 65 (*) >90 (mL/min)   CBC     Status: Abnormal   Collection Time   09/23/11  3:10 AM      Component Value Range Comment   WBC 8.8  4.0 - 10.5 (K/uL)    RBC 3.96 (*) 4.22 - 5.81 (MIL/uL)    Hemoglobin 11.9 (*) 13.0 - 17.0 (g/dL)    HCT 09.6 (*) 04.5 - 52.0 (%)    MCV 94.4  78.0 - 100.0 (fL)    MCH 30.1  26.0 - 34.0 (pg)    MCHC 31.8  30.0  - 36.0 (g/dL)    RDW 40.9  81.1 - 91.4 (%)    Platelets 161  150 - 400 (K/uL)     Imaging / Studies: Ct Abdomen Pelvis W Contrast  09/21/2011  *RADIOLOGY REPORT*  Clinical Data: Abdominal pain, possible small bowel obstruction  CT ABDOMEN AND PELVIS WITH CONTRAST  Technique:  Multidetector CT imaging of the abdomen and pelvis was performed following the standard protocol during bolus administration of intravenous contrast.  Contrast: OMNIPAQUE IOHEXOL 300 MG/ML  SOLN  Comparison: 09/08/2011  Findings: Sagittal images of the spine shows multilevel degenerative changes lumbar spine.  Lung bases shows bilateral posterior dependent atelectasis.  A small hiatal hernia is noted.  Fatty infiltration of the liver.  Calcified granuloma within spleen again noted.  Multiple large calcified gallstones are again noted within gallbladder without gallbladder distention the largest gallstone measures 1.5 cm.  No thickening of the gallbladder wall. Small hepatic cysts in the caudate lobe of the liver are stable.  Atherosclerotic calcifications and plaques within abdominal aorta and common iliac arteries again noted.  No aortic aneurysm.  Again noted mild renal atrophy.  No hydronephrosis or hydroureter.  Stable cyst in the lower pole of the right kidney measures 1.6 cm.  There are distended multiple small bowel loops with fluid contrast and air fluid levels.  The terminal ileum is smaller caliber. Again noted focal narrowing of the small bowel caliber at the level distal ileum see axial image 77.  Again noted mild enhancement of the small bowel wall at the level of transition in the terminal ileum see axial image 76. In axial image 73 there is mild thickening of the small bowel wall in the distal ileum.  This may be due to edema. Neoplastic process cannot be excluded.  Findings are consistent with small bowel obstruction.  No any contrast material noted within the right colon.  Small amount of free fluid is noted in the  right posterior pelvis just anterior to common iliac artery.  Multiple sigmoid colon diverticula are noted without evidence of acute diverticulitis.  Diverticula are noted descending colon without evidence of acute diverticulitis.  The right colon is empty collapsed.  Again noted right inguinal hernia containing fat  and fluid without change from prior exam.  No pelvic free air.  IMPRESSION:  1.  Again noted changes of small bowel obstruction with transition zone in the region of the distal ileum.  There is mild enhancement and thickening of the wall of the ileum with narrowing of the lumen.  Again findings may be due to mass, inflammatory stricture or adhesion. 2.  Cholelithiasis again noted. 3.  Stable small right inguinal hernia containing fat and fluid. 4.  Degenerative changes lumbar spine again noted.  Original Report Authenticated By: Natasha Mead, M.D.    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 male  1 Day Post-Op  Procedure(s): LAPAROSCOPY DIAGNOSTIC  Stabilizing with post-op ileus  Plan:  -transfer to telemetry -NGT until ileus resolves -PT/OT evals -MS improving - follow -VTE prophylaxis- SCDs, etc -mobilize as tolerated to help recovery  Ardeth Sportsman, M.D., F.A.C.S. Gastrointestinal and Minimally Invasive Surgery [facility] [address], [address] [place], [place] [address] [phone]  Main / Paging [phone] Voice Mail   09/23/2011

## 2011-09-23 NOTE — Care Management Note (Unsigned)
    Page 1 of 1   09/23/2011     3:42:04 PM   CARE MANAGEMENT NOTE 09/23/2011  Patient:  Austin Pittman, Austin Pittman   Account Number:  [id number]  Date Initiated:  09/23/2011  Documentation initiated by:  Lanier Clam  Subjective/Objective Assessment:   ADMITTED W/SBO.     Action/Plan:   FROM HOME   Anticipated DC Date:  09/27/2011   Anticipated DC Plan:  HOME/SELF CARE      DC Planning Services  CM consult      Choice offered to / List presented to:             Status of service:  In process, will continue to follow Medicare Important Message given?   (If response is "NO", the following Medicare IM given date fields will be blank) Date Medicare IM given:   Date Additional Medicare IM given:    Discharge Disposition:    Per UR Regulation:  Reviewed for med. necessity/level of care/duration of stay  If discussed at Long Length of Stay Meetings, dates discussed:    Comments:  09/23/11 [facility] Paublo Warshawsky RN,BSN NCM [phone]

## 2011-09-23 NOTE — Progress Notes (Signed)
Utilization review completed.  

## 2011-09-23 NOTE — Progress Notes (Signed)
Patient fell from chair getting up on his own.  Denies pain or being hurt; no bruising, skin tears, or abrasions noted.  VS 146/82, 115, 18, 98.2.  Daughter and on-call MD notified.

## 2011-09-23 NOTE — Clinical Documentation Improvement (Signed)
CHANGE MENTAL STATUS DOCUMENTATION CLARIFICATION   THIS DOCUMENT IS NOT A PERMANENT PART OF THE MEDICAL RECORD  TO RESPOND TO THE THIS QUERY, FOLLOW THE INSTRUCTIONS BELOW:  1. If needed, update documentation for the patient's encounter via the notes activity.  2. Access this query again and click edit on the In [employer].  3. After updating, or not, click F2 to complete all highlighted (required) fields concerning your review. Select "additional documentation in the medical record" OR "no additional documentation provided".  4. Click Sign note button.  5. The deficiency will fall out of your In Basket *Please let us know if you are not able to complete this workflow by phone or e-mail (listed below).         09/23/11  Dear Dr. Salvatore [facility]  In an effort to better capture your patient's severity of illness, reflect appropriate length of stay and utilization of resources, a review of the patient medical record has revealed the following indicators.    Based on your clinical judgment, please clarify and document in a progress note and/or discharge summary the clinical condition associated with the following supporting information:  In responding to this query please exercise your independent judgment.  The fact that a query is asked, does not imply that any particular answer is desired or expected.  Pt admitted with SBO.  According to PN 09/23/11 pt with acute confusion due to known condition s/p LAPAROSCOPIC ILEOCECECTOMY.   Please clarify if "acute confusion due to known condition." can be further specified as one of the diagnosis listed below and document in pn and d/c summary.   Possible Clinical Conditions?  _______Encephalopathy (describe type if known)                       Anoxic                       Septic                       Alcoholic                        Hepatic                       Hypertensive                       Metabolic     _______ Toxic   ______Post Op Confusion ______Other Condition__________________ _______Cannot Clinically Determine   Supporting Information:  Risk Factors: Recurrent/persistent small bowel obstruction, Acute confusion due to known medical condition ,acemaker, WG:NFAOZH tach, R BBB, 2nd degree Mobitz II AV block,   Signs & Symptoms:  Diagnostics: CT ABD IMPRESSION:   1.  Again noted changes of small bowel obstruction with transition zone in the region of the distal ileum.  There is mild enhancement and thickening of the wall of the ileum with narrowing of the lumen.  Again findings may be due to mass, inflammatory stricture or adhesion. 2.  Cholelithiasis again noted. 3.  Stable small right inguinal hernia containing fat and fluid. 4.  Degenerative changes lumbar spine again noted.  Treatment: haloperidol lactate (HALDOL) injection 2-5 mg [[id number]]    LORazepam (ATIVAN) injection 0.5-1 mg [[id number]]   Reviewed: additional documentation in the medical record PA responded with "mild confusion" ljh  Thank You,  Enis Slipper  RN,  BSN, CCDS Clinical Documentation Specialist [facility] Dept Pager: [phone] / E-mail: [email or web address]  Health Information Management [place]

## 2011-09-24 LAB — CBC
MCH: 30.4 pg (ref 26.0–34.0)
MCHC: 32.4 g/dL (ref 30.0–36.0)
RDW: 13.1 % (ref 11.5–15.5)

## 2011-09-24 LAB — CREATININE, SERUM: Creatinine, Ser: 1.09 mg/dL (ref 0.50–1.35)

## 2011-09-24 LAB — POTASSIUM: Potassium: 3.9 mEq/L (ref 3.5–5.1)

## 2011-09-24 NOTE — Evaluation (Signed)
Occupational Therapy Evaluation Patient Details Name: Austin Pittman MRN: [id number] DOB: 27-May-1915 Today's Date: 09/24/2011 Time: 1191-4782 OT Time Calculation (min): 25 min  OT Assessment / Plan / Recommendation Clinical Impression  patient is a 78 -year-old male who was discharged from the hospital one week ago following an approximately 1 week hospitalization for small bowel obstruction. This resolved with conservative management. He did well for the first 4 or 5 days but about 36 hours ago developed progressive crampy lower abdominal pain then nausea and then last night had frequent vomiting all night with bilious and then feculent vomiting. Pt displays decreased strenght, safety and independence with ADL. Will benefit from skilled OT services to improve safety with ADL tasks for discharge to family's home.    OT Assessment  Patient needs continued OT Services    Follow Up Recommendations  Home health OT    Barriers to Discharge      Equipment Recommendations  None recommended by PT;Tub/shower bench;Other (comment) (tubbench if pt agreeable)    Recommendations for Other Services    Frequency  Min 2X/week    Precautions / Restrictions Precautions Precautions: Fall        ADL  Eating/Feeding: NPO Grooming: Performed;Minimal assistance Where Assessed - Grooming: Unsupported standing Upper Body Bathing: Simulated;Chest;Right arm;Left arm;Abdomen;Supervision/safety;Set up Where Assessed - Upper Body Bathing: Unsupported sitting Lower Body Bathing: Simulated;Minimal assistance Where Assessed - Lower Body Bathing: Supported sit to stand Upper Body Dressing: Simulated;Supervision/safety;Set up Where Assessed - Upper Body Dressing: Unsupported sitting Lower Body Dressing: Simulated;Minimal assistance Where Assessed - Lower Body Dressing: Sopported sit to stand Toilet Transfer: Performed;Minimal assistance Toilet Transfer Method: Other (comment) (ambulating. mod verbal cues for  safety) Toilet Transfer Equipment: Comfort height toilet;Grab bars Toileting - Clothing Manipulation and Hygiene: Simulated;Minimal assistance Where Assessed - Glass blower/designer Manipulation and Hygiene: Standing Tub/Shower Transfer Method: Not assessed Equipment Used: Rolling walker ADL Comments: Pt frequently lets go of RW before fulling reaching toilet or chair and then trying to sit before fully backed up to surface. Needs mod verbal cues for safety to decrease fall risk. Pt's daughters present for session. Plan is for pt to go to daughter's house at discharge.     OT Diagnosis: Generalized weakness  OT Problem List: Decreased strength;Decreased activity tolerance;Decreased knowledge of use of DME or AE;Decreased safety awareness OT Treatment Interventions: Self-care/ADL training;Therapeutic activities;DME and/or AE instruction;Patient/family education   OT Goals Acute Rehab OT Goals OT Goal Formulation: With patient/family Time For Goal Achievement: 10/08/11 Potential to Achieve Goals: Good ADL Goals Pt Will Perform Grooming: with supervision;Standing at sink ADL Goal: Grooming - Progress: Goal set today Pt Will Perform Lower Body Bathing: with supervision;Sit to stand from chair;Sit to stand from bed ADL Goal: Lower Body Bathing - Progress: Goal set today Pt Will Perform Lower Body Dressing: with supervision;Sit to stand from chair;Sit to stand from bed ADL Goal: Lower Body Dressing - Progress: Goal set today Pt Will Transfer to Toilet: with supervision;with DME;Ambulation;3-in-1 ADL Goal: Toilet Transfer - Progress: Goal set today Pt Will Perform Toileting - Clothing Manipulation: with supervision;Standing ADL Goal: Toileting - Clothing Manipulation - Progress: Goal set today Pt Will Perform Tub/Shower Transfer: with supervision;Tub transfer;Transfer tub bench ADL Goal: Tub/Shower Transfer - Progress: Goal set today  Visit Information  Last OT Received On: 09/24/11 Assistance  Needed: +1    Subjective Data  Subjective: I feel better Patient Stated Goal: none stated. agreeable up with OT   Prior Functioning  Home Living Lives With: Alone  Available Help at Discharge: Family Type of Home: House Home Access: Stairs to enter [employer] of Steps: 4 Entrance Stairs-Rails: Right Home Layout: One level Bathroom Shower/Tub: Engineer, manufacturing systems: Standard Home Adaptive Equipment: Dan Humphreys - four wheeled;Straight cane;Bedside commode/3-in-1 Additional Comments: Per daughter pt does not have airconditioning. after DC 1 week ago, dtr states pt returned to gardening in heat Prior Function Level of Independence: Independent Able to Take Stairs?: Yes Driving: Yes Communication Communication: No difficulties    Cognition  Overall Cognitive Status: Impaired Area of Impairment: Safety/judgement Arousal/Alertness: Awake/alert Orientation Level: Appears intact for tasks assessed Behavior During Session: [facility] for tasks performed Safety/Judgement: Decreased awareness of safety precautions;Decreased safety judgement for tasks assessed;Decreased awareness of need for assistance    Extremity/Trunk Assessment Right Upper Extremity Assessment RUE ROM/Strength/Tone: Within functional levels Left Upper Extremity Assessment LUE ROM/Strength/Tone: Within functional levels (note left hand with some edema)   Mobility Bed Mobility Bed Mobility: Supine to Sit Rolling Right: 3: Mod assist;With rail Supine to Sit: 3: Mod assist;With rails;HOB elevated Transfers Transfers: Sit to Stand;Stand to Sit Sit to Stand: 4: Min assist;With upper extremity assist;From bed;From toilet Stand to Sit: 4: Min assist;With upper extremity assist;To chair/3-in-1;To toilet Details for Transfer Assistance: mod verbal and demo cues for hand placement, backing up to chair completly before sitting   Exercise    Balance    End of Session OT - End of Session Activity Tolerance:  Patient tolerated treatment well Patient left: in chair;with call bell/phone within reach;with chair alarm set;with family/visitor present   Lennox Laity [phone] 09/24/2011, 2:29 PM

## 2011-09-24 NOTE — Progress Notes (Signed)
2 Days Post-Op  Subjective: Says he's short of breath, nurse just took off his O2, he was trying to pull it out with NG.  No flatus we can tell. He fell last PM trying to get from chair to bed.  Objective: Vital signs in last 24 hours: Temp:  [98.2 F (36.8 C)-99.8 F (37.7 C)] 98.3 F (36.8 C) (06/11 0620) Pulse Rate:  [79-116] 79  (06/11 0620) Resp:  [18-20] 18  (06/11 0620) BP: (122-159)/(72-82) 139/77 mmHg (06/11 0620) SpO2:  [92 %-97 %] 97 % (06/11 0620) Last BM Date: 09/22/11  200/NG, afebrile, VSS, some tachycardia, WBC up some  Intake/Output from previous day: 06/10 0701 - 06/11 0700 In: 530 [P.O.:30; I.V.:500] Out: 650 [Urine:450; Emesis/NG output:200] Intake/Output this shift:    General appearance: alert, cooperative, no distress and He can tell me where he is, not sure of teh date, but he still seems a bit confused. Resp: clear to auscultation bilaterally and few rales in bases. GI: mildly distended, incision is ok, few hyperactive BS, abd is tender.  NG is working, 200 in Software engineer and that is a Immunologist.  Lab Results:   Basename 09/24/11 0505 09/23/11 0310  WBC 11.4* 8.8  HGB 12.2* 11.9*  HCT 37.7* 37.4*  PLT 189 161    BMET  Basename 09/24/11 0505 09/23/11 0310 09/22/11 0451  NA -- 135 141  K 3.9 4.0 --  CL -- 101 104  CO2 -- 26 27  GLUCOSE -- 156* 104*  BUN -- 18 29*  CREATININE 1.09 1.09 --  CALCIUM -- 7.7* 7.7*   PT/INR No results found for this basename: LABPROT:2,INR:2 in the last 72 hours   Lab 09/21/11 0948  AST 19  ALT 18  ALKPHOS 86  BILITOT 0.6  PROT 7.6  ALBUMIN 3.4*     Lipase     Component Value Date/Time   LIPASE 9* 09/21/2011 0948     Studies/Results: No results found.  Medications:    . heparin  5,000 Units Subcutaneous Q8H  . lip balm  1 application Topical BID  . Travoprost (BAK Free)  1 drop Both Eyes QHS    Assessment/Plan SBO at terminal ileum secondary to tumor vs adhesions  s/p LAPAROSCOPIC  ILEOCECECTOMY - 09/22/11 - Dr. Johna Sheriff  ATRIAL TACHYCARDIA/Mobitz II AVB, with PTVP  Hiatal Hernia  Cholelithiasis.  Small right inguinal hernia. Macular degeneration Heparin for DVT  Mild confusion.  Plan:  Continue NG suction for now, he has OT/PT/ OOb today, IS. Foley is out and he's asking for urinal.   LOS: 3 days   Austin Pittman,Austin Pittman 09/24/2011  Daughter, Olegario Messier, in room.  She was in [place] during his first hospitalization.  I answered questions about path (it is not back yet) and recovery (3-6 days for return of bowel function). He looks good.  Says he is hungry.  Got confused last PM and fell getting out of bed.  Ovidio Kin, MD, [facility] Surgery Pager: [phone] Office phone:  [phone]

## 2011-09-24 NOTE — Progress Notes (Signed)
Pt resting comfortably in bed. VS stable. Will continue to monitor.

## 2011-09-24 NOTE — Progress Notes (Signed)
Pt continuing to pull at tubes, attempting to get out of bed. Pt disoriented stating "let me go to the kitchen. I live here." Attempted to reorient pt but was unsuccessful. Pt reaching in air, appearing to have visual hallucinations. When asked what the pt was reaching for, the pt stated "rope". 3 staff members in room at this time trying to keep pt in bed and calm him down. IV haldol given at this time. Will continue to monitor.

## 2011-09-24 NOTE — Progress Notes (Signed)
Pt is anxious, attempting to pull NGT and get out of bed. Pt disoriented to place and situation at this time. Pt redirected to bed and attempted to reorient. Pt continued to pull at NGT and insist on getting out of bed. Offered to ambulate pt but pt refused stating he was "too tired, maybe later." 0.5mg  ativan given via IV to relieve anxiety. Will continue to monitor.

## 2011-09-24 NOTE — Evaluation (Signed)
Physical Therapy Evaluation Patient Details Name: Austin Pittman MRN: [id number] DOB: 26-Jun-1915 Today's Date: 09/24/2011 Time: 9528-4132 PT Time Calculation (min): 22 min  PT Assessment / Plan / Recommendation Clinical Impression  Pt readmitted after DC x 1 week for SBO worsening requiring lap.daughter expresses concern for DC to pt home due to no airconditioning. discussed options w/ pt/daughter for possible STSNF vs home w/ a daughter w/ HHPT. pt does not acknowledg his need for assistance at DC. pt will benefit from PT to improve safety , functional mobility .    PT Assessment  Patient needs continued PT services    Follow Up Recommendations  Home health PT;Skilled nursing facility;Supervision/Assistance - 24 hour    Barriers to Discharge   family willing for pt to stay with one of them, not at pt home w/ no airconditioning.    lEquipment Recommendations  None recommended by PT    Recommendations for Other Services     Frequency Min 3X/week    Precautions / Restrictions Precautions Precautions: Fall Restrictions Weight Bearing Restrictions: No   Pertinent Vitals/Pain C/o sore throat      Mobility  Bed Mobility Bed Mobility: Supine to Sit;Rolling Right Rolling Right: 3: Mod assist;With rail Supine to Sit: 3: Mod assist;With rails Details for Bed Mobility Assistance: multimodal TC/VC  to roll to R and push up to avoid abdominal stress. Transfers Transfers: Sit to Stand;Stand to Sit Sit to Stand: 4: Min assist;From bed;With upper extremity assist Stand to Sit: To chair/3-in-1;4: Min assist;Without upper extremity assist Details for Transfer Assistance: VC for hand placement Ambulation/Gait Ambulation/Gait Assistance: 4: Min assist Ambulation Distance (Feet): 100 Feet Assistive device: Rolling walker Ambulation/Gait Assistance Details: TC to guide RW, VC to stay inside RW, tends to veer to R. Gait Pattern: Step-through pattern    Exercises     PT Diagnosis: Difficulty  walking;Generalized weakness  PT Problem List: Decreased cognition;Decreased knowledge of use of DME;Decreased safety awareness;Decreased knowledge of precautions;Decreased mobility PT Treatment Interventions: DME instruction;Gait training;Functional mobility training;Therapeutic activities;Patient/family education   PT Goals Acute Rehab PT Goals PT Goal Formulation: With patient/family Time For Goal Achievement: 10/08/11 Potential to Achieve Goals: Good Pt will go Supine/Side to Sit: with supervision PT Goal: Supine/Side to Sit - Progress: Goal set today Pt will go Sit to Supine/Side: with supervision PT Goal: Sit to Supine/Side - Progress: Goal set today Pt will go Sit to Stand: with supervision PT Goal: Sit to Stand - Progress: Goal set today Pt will go Stand to Sit: with supervision PT Goal: Stand to Sit - Progress: Goal set today Pt will Ambulate: >150 feet;with supervision;with least restrictive assistive device PT Goal: Ambulate - Progress: Goal set today  Visit Information  Last PT Received On: 09/24/11 Assistance Needed: +1    Subjective Data  Subjective: I want to go home Patient Stated Goal: same as above   Prior Functioning  Home Living Lives With: Alone Available Help at Discharge: Family Type of Home: House Home Access: Stairs to enter Secretary/administrator of Steps: 4 Entrance Stairs-Rails: Right Home Layout: One level Bathroom Shower/Tub: Engineer, manufacturing systems: Standard Home Adaptive Equipment: Dan Humphreys - four wheeled;Straight cane Additional Comments: Per daughter pt does not have airconditioning. after DC 1 week ago, dtr states pt returned to gardening in heat Prior Function Level of Independence: Independent Able to Take Stairs?: Yes Driving: Yes Comments: daughter expresses concern that pt will get back to garden , be outside in heat and his house has no airconditioning. Communication  Communication: No difficulties    Cognition  Overall  Cognitive Status: Impaired Area of Impairment: Safety/judgement;Awareness of deficits Arousal/Alertness: Awake/alert Orientation Level: Appears intact for tasks assessed Behavior During Session: [facility] for tasks performed Safety/Judgement: Decreased awareness of safety precautions;Decreased safety judgement for tasks assessed;Decreased awareness of need for assistance Safety/Judgement - Other Comments: pt requires alarm as he tries to get up w/out asssit. has tried to pull  iv and NG Cognition - Other Comments: daughter very concerned for DC plan. 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 states he is not making sound decisions.    Extremity/Trunk Assessment Right Upper Extremity Assessment RUE ROM/Strength/Tone: [facility] for tasks assessed Left Upper Extremity Assessment LUE ROM/Strength/Tone: WFL for tasks assessed Right Lower Extremity Assessment RLE ROM/Strength/Tone: WFL for tasks assessed Left Lower Extremity Assessment LLE ROM/Strength/Tone: WFL for tasks assessed Trunk Assessment Trunk Assessment: Normal   Balance    End of Session PT - End of Session Equipment Utilized During Treatment: Gait belt Activity Tolerance: Patient tolerated treatment well Patient left: in chair;with call bell/phone within reach;with chair alarm set;with family/visitor present Nurse Communication: Mobility status   Rada Hay 09/24/2011, 9:06 AM  [phone]

## 2011-09-25 ENCOUNTER — Inpatient Hospital Stay (HOSPITAL_COMMUNITY): Payer: Medicare Other

## 2011-09-25 NOTE — Progress Notes (Signed)
3 Days Post-Op  Subjective: Confused again last night pulled NG out and got haldol last PM.  He is cooperative, but has no idea where he is or what's going on.  Objective: Vital signs in last 24 hours: Temp:  [97.6 F (36.4 C)-98.2 F (36.8 C)] 97.6 F (36.4 C) (06/11 2307) Pulse Rate:  [78-79] 78  (06/11 2307) Resp:  [16] 16  (06/11 2307) BP: (116)/(48) 116/48 mmHg (06/11 2307) SpO2:  [97 %] 97 % (06/11 2307) Last BM Date: 09/22/11 800 ml from NG yesterday, 600 ml urine recorded. Afebrile, VSS, no labs  Intake/Output from previous day: 06/11 0701 - 06/12 0700 In: 1250 [I.V.:1250] Out: 1400 [Urine:600; Emesis/NG output:800] Intake/Output this shift:    General appearance: alert, no distress and confused Resp: Rales Right base more than left GI: soft, +BS, I cant tell if he's passing flatus.  Incision looks fine Extremities: no edema  Lab Results:   Basename 09/24/11 0505 09/23/11 0310  WBC 11.4* 8.8  HGB 12.2* 11.9*  HCT 37.7* 37.4*  PLT 189 161    BMET  Basename 09/24/11 0505 09/23/11 0310  NA -- 135  K 3.9 4.0  CL -- 101  CO2 -- 26  GLUCOSE -- 156*  BUN -- 18  CREATININE 1.09 1.09  CALCIUM -- 7.7*   PT/INR No results found for this basename: LABPROT:2,INR:2 in the last 72 hours   Lab 09/21/11 0948  AST 19  ALT 18  ALKPHOS 86  BILITOT 0.6  PROT 7.6  ALBUMIN 3.4*     Lipase     Component Value Date/Time   LIPASE 9* 09/21/2011 0948     Studies/Results: No results found.  Medications:    . heparin  5,000 Units Subcutaneous Q8H  . lip balm  1 application Topical BID  . Travoprost (BAK Free)  1 drop Both Eyes QHS    Assessment/Plan SBO at terminal ileum secondary to tumor vs adhesions  s/p LAPAROSCOPIC ILEOCECECTOMY - 09/22/11 - Dr. Johna Sheriff POD4 ATRIAL TACHYCARDIA/Mobitz II AVB, with PTVP  Hiatal Hernia  Cholelithiasis.  Small right inguinal hernia.  Macular degeneration  Heparin for DVT  Worsening confusion.  Plan:  I have ask  Charge Nurse about a sitter, they don't think he needs one now.  We have Ot/PT seeing him.  I have ask they bring him out to the main nursing area and get him out of the room into more light. He needs IS q1h, but it may be hard to accomplish this, but he clearly won't do it on his own.  I will start clamping trials and see how he does, I don't think he can tell us if he's really passing flatus, labs,and CXR tomorrow. NG was curled up on itsself, and not working, they tried to reposition it but without success.  I'm going to leave it out and leave him on sips of clears.    LOS: 4 days    Evagelia Knack 09/25/2011

## 2011-09-25 NOTE — Progress Notes (Signed)
Agree with above.  Incision looks good.  Abdomen soft.  Questionable flatus.  Would leave NG out and leave him on sips for now.  Will need SW consult/ SNF placement.  Wilmon Arms. Corliss Skains, MD, [facility] Surgery  09/25/2011 2:50 PM

## 2011-09-25 NOTE — Progress Notes (Signed)
Occupational Therapy Treatment Patient Details Name: Austin Pittman MRN: [id number] DOB: 01/24/1916 Today's Date: 09/25/2011 Time: 6213-0865 OT Time Calculation (min): 30 min  OT Assessment / Plan / Recommendation Comments on Treatment Session Discussed recommendation for SNF after seeing patient today and much more decreased safety awareness evident. Will need SW consult.     Follow Up Recommendations  Skilled nursing facility; if pt/family decide to discharge home then will need Home health OT and 24/7 assist.   Barriers to Discharge       Equipment Recommendations  None recommended by PT;Tub/shower bench;Other (comment) (tubbench if home and pt agreeable)    Recommendations for Other Services    Frequency Min 2X/week   Plan Discharge plan needs to be updated    Precautions / Restrictions Precautions Precautions: Fall Restrictions Weight Bearing Restrictions: No        ADL  Toilet Transfer: Performed;Minimal assistance;Other (comment) (max verbal cues for safety. see below) Toilet Transfer Method: Other (comment) (ambulating) Toilet Transfer Equipment: Raised toilet seat with arms (or 3-in-1 over toilet) Toileting - Clothing Manipulation and Hygiene: Simulated;Moderate assistance;Other (comment) (pt not paying attention to gown needing to be pulled up) Where Assessed - Toileting Clothing Manipulation and Hygiene: Standing Equipment Used: Rolling walker ADL Comments: Daughters present. Discussed pt will need 24 hour assist and has very poor safety awareness. Discussed SNF option to increase his safety and independence with ADL. Also discussed with pt and he said, "I will need to talk to my family." Will request Social Worker consult. Pt with poor safety awareness. He lets go of RW prematurely and tries to step back to chair or 3in1 without both hands on RW. He pushes walker too far out in front of him and runs his walker into the doorway. (note macular degeneration in history). Needs  cues for hand placement with all tasks.     OT Diagnosis:    OT Problem List:   OT Treatment Interventions:     OT Goals ADL Goals ADL Goal: Toilet Transfer - Progress: Not progressing ADL Goal: Toileting - Clothing Manipulation - Progress: Not progressing  Visit Information  Last OT Received On: 09/25/11 Assistance Needed: +1    Subjective Data  Subjective: pt opens eyes to OT voice Patient Stated Goal: none stated. agreeable up with OT   Prior Functioning       Cognition  Overall Cognitive Status: Impaired Area of Impairment: Safety/judgement;Awareness of errors Arousal/Alertness: Awake/alert Behavior During Session: [facility] for tasks performed Safety/Judgement: Decreased awareness of safety precautions;Decreased safety judgement for tasks assessed Awareness of Errors: Assistance required to identify errors made;Assistance required to correct errors made    Mobility Bed Mobility Bed Mobility: Supine to Sit Rolling Right: 4: Min assist;With rail Supine to Sit: 3: Mod assist;With rails Details for Bed Mobility Assistance: multimodal cues for technique Transfers Transfers: Sit to Stand;Stand to Sit Sit to Stand: 4: Min assist;With upper extremity assist;From bed;From chair/3-in-1 Stand to Sit: 4: Min assist;With upper extremity assist;To chair/3-in-1 Details for Transfer Assistance: consistent verbal and tactile cues for hand placement, RW safety, distance from self to RW   Exercises    Balance    End of Session OT - End of Session Activity Tolerance: Patient tolerated treatment well Patient left: in chair;with chair alarm set;with family/visitor present   Lennox Laity [phone] 09/25/2011, 11:15 AM

## 2011-09-25 NOTE — Plan of Care (Signed)
Problem: Phase II Progression Outcomes Goal: Return of bowel function (flatus, BM) IF ABDOMINAL SURGERY:  Outcome: Progressing + flatus     

## 2011-09-25 NOTE — Progress Notes (Signed)
Pt pulled NGT out. Pt confused and agitated at this time, trying to climb out of bed, pulling at telemetry leads and IV site. IV site secured with kerlix and tape. 3 mg Haldol via IV administered. NGT reinserted. Pt tolerated fairly well. KUB ordered to check placement. Pt resting comfortably in bed at this time. Will continue to monitor.

## 2011-09-26 ENCOUNTER — Inpatient Hospital Stay (HOSPITAL_COMMUNITY): Payer: Medicare Other

## 2011-09-26 LAB — BASIC METABOLIC PANEL
BUN: 15 mg/dL (ref 6–23)
CO2: 26 mEq/L (ref 19–32)
Calcium: 8.2 mg/dL — ABNORMAL LOW (ref 8.4–10.5)
Chloride: 103 mEq/L (ref 96–112)
Creatinine, Ser: 1.01 mg/dL (ref 0.50–1.35)

## 2011-09-26 LAB — CBC
HCT: 34.1 % — ABNORMAL LOW (ref 39.0–52.0)
MCH: 30.5 pg (ref 26.0–34.0)
MCV: 91.9 fL (ref 78.0–100.0)
Platelets: 196 10*3/uL (ref 150–400)
RBC: 3.71 MIL/uL — ABNORMAL LOW (ref 4.22–5.81)
RDW: 13 % (ref 11.5–15.5)

## 2011-09-26 LAB — MAGNESIUM: Magnesium: 1.7 mg/dL (ref 1.5–2.5)

## 2011-09-26 MED ORDER — MENTHOL 3 MG MT LOZG
1.0000 | LOZENGE | OROMUCOSAL | Status: DC | PRN
  Filled 2011-09-26: qty 9

## 2011-09-26 MED ORDER — POTASSIUM CHLORIDE IN NACL 40-0.9 MEQ/L-% IV SOLN
INTRAVENOUS | Status: DC
  Filled 2011-09-26 (×2): qty 1000

## 2011-09-26 NOTE — Clinical Social Work Psychosocial (Unsigned)
     Clinical Social Work Department BRIEF PSYCHOSOCIAL ASSESSMENT 09/26/2011  Patient:  Austin Pittman, Austin Pittman     Account Number:  [id number]     Admit date:  09/21/2011  Clinical Social Worker:  Hattie Perch  Date/Time:  09/26/2011 12:00 M  Referred by:  Physician  Date Referred:  09/26/2011 Referred for  SNF Placement   Other Referral:   Interview type:  Patient Other interview type:   daughter at bedside.    PSYCHOSOCIAL DATA Living Status:  ALONE Admitted from facility:   Level of care:   Primary support name:  kathy mizzelle Primary support relationship to patient:  CHILD, ADULT Degree of support available:   good    CURRENT CONCERNS Current Concerns  Post-Acute Placement   Other Concerns:    SOCIAL WORK ASSESSMENT / PLAN patient is alert and oriented x3. patient in need of SNF placement. patient is agreeable to snf and daughter is requesting [facility].   Assessment/plan status:   Other assessment/ plan:   Information/referral to community resources:    PATIENTS/FAMILYS RESPONSE TO PLAN OF CARE: family and patient agreeable to snf placement at [facility].

## 2011-09-26 NOTE — Progress Notes (Signed)
PT Cancellation Note  Treatment cancelled today due to patient's refusal to participate. Pt reports he's been walking back and forth to the bathroom all day and doesn't feel like walking now.   Austin Pittman 09/26/2011, 2:59 PM

## 2011-09-26 NOTE — Progress Notes (Signed)
Pt had a large, watery, brown BM this AM. Active BS, + flatus.

## 2011-09-26 NOTE — Progress Notes (Signed)
Pt had another BM, more loose, less watery.

## 2011-09-26 NOTE — Clinical Social Work Placement (Unsigned)
     Clinical Social Work Department CLINICAL SOCIAL WORK PLACEMENT NOTE 09/26/2011  Patient:  Austin Pittman, Austin Pittman  Account Number:  [id number] Admit date:  09/21/2011  Clinical Social Worker:  Becky Sax, LCSW  Date/time:  09/26/2011 12:00 M  Clinical Social Work is seeking post-discharge placement for this patient at the following level of care:   SKILLED NURSING   (*CSW will update this form in Epic as items are completed)   09/26/2011  Patient/family provided with [facility] Department of Clinical Social Works list of facilities offering this level of care within the geographic area requested by the patient (or if unable, by the patients family).  09/26/2011  Patient/family informed of their freedom to choose among providers that offer the needed level of care, that participate in Medicare, Medicaid or managed care program needed by the patient, have an available bed and are willing to accept the patient.  09/26/2011  Patient/family informed of [facility] ownership interest in [facility], as well as of the fact that they are under no obligation to receive care at this facility.  PASARR submitted to EDS on 09/26/2011 PASARR number received from [employer] on   FL2 transmitted to all facilities in geographic area requested by pt/family on  09/26/2011 FL2 transmitted to all facilities within larger geographic area on   Patient informed that his/her managed care company has contracts with or will negotiate with  certain facilities, including the following:     Patient/family informed of bed offers received:  09/26/2011 Patient chooses bed at [facility] AND [facility] Physician recommends and patient chooses bed at    Patient to be transferred to  on   Patient to be transferred to facility by   The following physician request were entered in Epic:   Additional Comments:

## 2011-09-26 NOTE — Progress Notes (Signed)
[id number] options for home care versus going to short term rehab with patient and daughter.

## 2011-09-26 NOTE — Progress Notes (Signed)
General Surgery [facility] - Attending  Patient up in chair.  Multiple soft BM's today.  Taking liquids.  Family at bedside.  Final path shows T-V adenoma, no malignancy.  Velora Heckler, MD, [facility] Office: [phone]

## 2011-09-26 NOTE — Progress Notes (Signed)
CSW met with patient and patients family at bedside. Discussed probable need for SNF. Patient agreeable to SNF at [facility] when medically stable. FL2 completed and placed on chart for physician signature. Patient accepted at [facility]. Pending medical clearance.  Austin Pittman C. [name] MSW, LCSW [phone]

## 2011-09-26 NOTE — Progress Notes (Signed)
4 Days Post-Op  Subjective: Just back from Xray, he is much more oriented this AM told me he had 2 BM and saved it for nurse to see.     Objective: Vital signs in last 24 hours: Temp:  [97.6 F (36.4 C)-98.7 F (37.1 C)] 97.6 F (36.4 C) (06/13 0529) Pulse Rate:  [86-92] 86  (06/13 0529) Resp:  [18-22] 18  (06/13 0529) BP: (122-140)/(62-77) 140/77 mmHg (06/13 0529) SpO2:  [94 %-98 %] 98 % (06/13 0529) Last BM Date: 09/22/11  1 stool recorded yesterday, afebrile, VSS, K+ is low,  Intake/Output from previous day:   Intake/Output this shift:    General appearance: alert, cooperative, no distress and says he hurts when he coughs, but no other complaints. Resp: still has rales R base. GI: soft, +BS, incision looks good, no distension, says he's having gas, nurse confirms BM.  Lab Results:   [facility] 09/26/11 0504 09/24/11 0505  WBC 8.3 11.4*  HGB 11.3* 12.2*  HCT 34.1* 37.7*  PLT 196 189    BMET  Basename 09/26/11 0504 09/24/11 0505  NA 138 --  K 3.4* 3.9  CL 103 --  CO2 26 --  GLUCOSE 114* --  BUN 15 --  CREATININE 1.01 1.09  CALCIUM 8.2* --   PT/INR No results found for this basename: LABPROT:2,INR:2 in the last 72 hours   Lab 09/21/11 0948  AST 19  ALT 18  ALKPHOS 86  BILITOT 0.6  PROT 7.6  ALBUMIN 3.4*     Lipase     Component Value Date/Time   LIPASE 9* 09/21/2011 0948     Studies/Results: Dg Abd 1 View  09/25/2011  *RADIOLOGY REPORT*  Clinical Data: Nasogastric tube placement.  ABDOMEN - 1 VIEW  Comparison: 09/21/2011 CT.  Findings: Nasogastric tube enters the stomach, is curled upon itself and redirected and is cephalad position.  The side hole is at the level of the distal esophagus.  The tip is not completely imaged on the present exam extending into the thoracic esophagus.  Gas distended small and large bowel with slightly prominent folds. Small bowel measures up to 4.7 cm.  The possibility of free intraperitoneal air cannot be addressed on a  supine view.  IMPRESSION: Nasogastric tube enters the stomach, is curled upon itself and redirected and is cephalad position.  The side hole is at the level of the distal esophagus.  The tip is not completely imaged on the present exam extending into the thoracic esophagus.  Gas distended small and large bowel with slightly prominent folds. Small bowel measures up to 4.7 cm.  Original Report Authenticated By: Fuller Canada, M.D.    Medications:    . heparin  5,000 Units Subcutaneous Q8H  . lip balm  1 application Topical BID  . Travoprost (BAK Free)  1 drop Both Eyes QHS    Assessment/Plan SBO at terminal ileum secondary to tumor vs adhesions Path: Tubovilinous Adenoma. s/p LAPAROSCOPIC ILEOCECECTOMY - 09/22/11 - Dr. Johna Sheriff POD4  ATRIAL TACHYCARDIA/Mobitz II AVB, with PTVP  Hiatal Hernia  Cholelithiasis.  Small right inguinal hernia.  Macular degeneration  Heparin for DVT  Worsening confusion.   Plan:  Go from sips to clear tray, advance to fulls later if he does well.  OT/PT, let case worker talk with family about placement.  LOS: 5 days    Austin Pittman 09/26/2011

## 2011-09-26 NOTE — Progress Notes (Signed)
Spoke with patient and daughter. Would like to go to [facility] for rehab.  TCT [place] -CSW for referral to come and talk with patient.

## 2011-09-27 ENCOUNTER — Encounter (INDEPENDENT_AMBULATORY_CARE_PROVIDER_SITE_OTHER): Payer: Medicare Other

## 2011-09-27 MED ORDER — ACETAMINOPHEN 325 MG PO TABS: 650.0000 mg | ORAL_TABLET | ORAL | Status: DC | PRN

## 2011-09-27 NOTE — Progress Notes (Signed)
Will Marlyne Beards, PA-C notified that patient had tolerated breakfast well and that [facility] had a bed ready for the patient.  DC is planned for around 1300.

## 2011-09-27 NOTE — Progress Notes (Signed)
General Surgery [facility] - Attending  Patient discharged to SNF this afternoon.  Will follow up at [facility] office.  Velora Heckler, MD, [facility] Office: [phone]

## 2011-09-27 NOTE — Progress Notes (Signed)
Patient cleared for discharge.patient accepted to [facility]. Daughter going at 11am to complete paperwork. Daughter to transport. Packet copied and given to nurse.  Roxanne Panek C. Winford Hehn MSW, LCSW [phone]

## 2011-09-27 NOTE — Progress Notes (Signed)
Patient being discharged to [facility], report called,and given to Nelta Numbers RN. No c/o pain or discomfort noted family at bedside.

## 2011-09-27 NOTE — Discharge Summary (Signed)
General Surgery [facility] - Attending  Agree with above.  Velora Heckler, MD, [facility] Office: [phone]

## 2011-09-27 NOTE — Progress Notes (Signed)
5 Days Post-Op  Subjective: Up and getting ready to shower. Knows he's in hospital,  "to get cut."  Objective: Vital signs in last 24 hours: Temp:  [97.9 F (36.6 C)-98.4 F (36.9 C)] 98 F (36.7 C) (06/14 0600) Pulse Rate:  [86-100] 100  (06/14 0600) Resp:  [16-20] 18  (06/14 0600) BP: (107-151)/(60-89) 137/60 mmHg (06/14 0600) SpO2:  [95 %-100 %] 100 % (06/14 0600) Last BM Date: 09/26/11  6 stools recorded yesterday, family said it was loose and watery.  Afebrile, BSS, labs OK yesterday   Intake/Output from previous day: 06/13 0701 - 06/14 0700 In: 1670 [P.O.:1670] Out: -  Intake/Output this shift:    General appearance: alert, cooperative and no distress Resp: clear to auscultation bilaterally GI: soft, +BS, taking full liquids going up to soft today.  wounds looks good.  Lab Results:   [facility] 09/26/11 0504  WBC 8.3  HGB 11.3*  HCT 34.1*  PLT 196    BMET  Basename 09/26/11 0504  NA 138  K 3.4*  CL 103  CO2 26  GLUCOSE 114*  BUN 15  CREATININE 1.01  CALCIUM 8.2*   PT/INR No results found for this basename: LABPROT:2,INR:2 in the last 72 hours   Lab 09/21/11 0948  AST 19  ALT 18  ALKPHOS 86  BILITOT 0.6  PROT 7.6  ALBUMIN 3.4*     Lipase     Component Value Date/Time   LIPASE 9* 09/21/2011 0948     Studies/Results: Dg Chest 2 View  09/26/2011  *RADIOLOGY REPORT*  Clinical Data: Postop for repair of small bowel obstruction  CHEST - 2 VIEW  Comparison: Chest x-ray of 11/08/2008  Findings: Mild basilar atelectasis remains.  The heart is within upper limits of normal.  A permanent pacemaker is unchanged in position.  The bones are osteopenic  IMPRESSION: No change in mild basilar atelectasis.  Permanent pacemaker remains.  Original Report Authenticated By: Juline Patch, M.D.   Dg Abd 1 View  09/25/2011  *RADIOLOGY REPORT*  Clinical Data: Nasogastric tube placement.  ABDOMEN - 1 VIEW  Comparison: 09/21/2011 CT.  Findings: Nasogastric tube enters  the stomach, is curled upon itself and redirected and is cephalad position.  The side hole is at the level of the distal esophagus.  The tip is not completely imaged on the present exam extending into the thoracic esophagus.  Gas distended small and large bowel with slightly prominent folds. Small bowel measures up to 4.7 cm.  The possibility of free intraperitoneal air cannot be addressed on a supine view.  IMPRESSION: Nasogastric tube enters the stomach, is curled upon itself and redirected and is cephalad position.  The side hole is at the level of the distal esophagus.  The tip is not completely imaged on the present exam extending into the thoracic esophagus.  Gas distended small and large bowel with slightly prominent folds. Small bowel measures up to 4.7 cm.  Original Report Authenticated By: Fuller Canada, M.D.    Medications:    . heparin  5,000 Units Subcutaneous Q8H  . lip balm  1 application Topical BID  . Travoprost (BAK Free)  1 drop Both Eyes QHS    Assessment/Plan SBO at terminal ileum secondary to tumor vs adhesions Path: Tubovilinous Adenoma.  s/p LAPAROSCOPIC ILEOCECECTOMY - 09/22/11 - Dr. Johna Sheriff POD4  ATRIAL TACHYCARDIA/Mobitz II AVB, with PTVP  Hiatal Hernia  Cholelithiasis.  Small right inguinal hernia.  Macular degeneration  Heparin for DVT  Confusion resolving  Plan:  He is up for shower, and a soft diet this Am.  If he does well we may be able to let him go to SNF later today.  I will get him ready for transfer to SNF.     LOS: 6 days    Nolen Lindamood 09/27/2011

## 2011-09-27 NOTE — Discharge Instructions (Signed)
Intestinal Obstruction An intestinal obstruction comes from a physical blockage in the bowel. Different problems in the bowel may cause these blockages.  CAUSES   Adhesions from previous surgeries   Cancer or tumor   A hernia, which is a condition in which a portion of the bowel bulges out through an opening or weakness in the abdomen (belly). This sometimes squeezes the bowel.   A swallowed object.   Blockage (impaction) with worms is common in third world countries.   A twisting of the bowel or telescoping of a portion of the bowel into another portion (intussusceptions)   Anything that stops food from going through from the stomach to the anus.  SYMPTOMS  Symptoms of bowel obstruction may result in your belly getting bigger (bloating), nausea, vomiting, explosive diarrhea, explosive stool. You may not be able to hear your normal bowel sounds (such as "growling in your stomach"). You may also stop having bowel movements or passing gas. DIAGNOSIS  Usually this condition is diagnosed with a history and an examination. Often, lab studies (blood work) and x-rays may be used to find the cause. TREATMENT  The main treatment of this condition is to rest the intestine (gut). Often times the obstruction may relieve itself and allow the gut to start working again. Think of the gut like a balloon that is blown up (filled with trapped food and water) which has squeezed into a hole or area which it cannot get through.   If the obstruction is complete, an NG tube (nasogastric) is passed through the nose and into the stomach. It is then connected to suction to keep the stomach emptied out. This also helps treat the nausea and vomiting.   If there is an imbalance in the electrolytes, they are corrected with intravenous fluids. These have the proper chemicals in them to correct the problem.   If the reason for the obstruction (blockage) does not get better with conservative (non-surgical) treatment,  surgery may be necessary. Sometimes, surgery is done immediately if your surgeon knows that the problem is not going to get better with conservative treatment.  PROGNOSIS  Depending on what the problem is, most of these problems can be treated by your caregivers with good results. Your surgeon will discuss the best course of action to take, with you or your loved ones. FOLLOWING SURGERY Seek immediate medical attention if you have:  Increasing abdominal pain, repeated vomiting, dehydration or fainting.   Severe weakness, chest pain or back pain.   Blood in your vomit, stool or you have tarry stool.  Document Released: 06/22/2003 Document Revised: 03/21/2011 Document Reviewed: 11/20/2007 [facility] Patient Information 2012 [place], [place].Intestinal Obstruction An intestinal obstruction comes from a physical blockage in the bowel. Different problems in the bowel may cause these blockages.  CAUSES   Adhesions from previous surgeries   Cancer or tumor   A hernia, which is a condition in which a portion of the bowel bulges out through an opening or weakness in the abdomen (belly). This sometimes squeezes the bowel.   A swallowed object.   Blockage (impaction) with worms is common in third world countries.   A twisting of the bowel or telescoping of a portion of the bowel into another portion (intussusceptions)   Anything that stops food from going through from the stomach to the anus.  SYMPTOMS  Symptoms of bowel obstruction may result in your belly getting bigger (bloating), nausea, vomiting, explosive diarrhea, explosive stool. You may not be able to hear your  normal bowel sounds (such as "growling in your stomach"). You may also stop having bowel movements or passing gas. DIAGNOSIS  Usually this condition is diagnosed with a history and an examination. Often, lab studies (blood work) and x-rays may be used to find the cause. TREATMENT  The main treatment of this condition is to rest  the intestine (gut). Often times the obstruction may relieve itself and allow the gut to start working again. Think of the gut like a balloon that is blown up (filled with trapped food and water) which has squeezed into a hole or area which it cannot get through.   If the obstruction is complete, an NG tube (nasogastric) is passed through the nose and into the stomach. It is then connected to suction to keep the stomach emptied out. This also helps treat the nausea and vomiting.   If there is an imbalance in the electrolytes, they are corrected with intravenous fluids. These have the proper chemicals in them to correct the problem.   If the reason for the obstruction (blockage) does not get better with conservative (non-surgical) treatment, surgery may be necessary. Sometimes, surgery is done immediately if your surgeon knows that the problem is not going to get better with conservative treatment.  PROGNOSIS  Depending on what the problem is, most of these problems can be treated by your caregivers with good results. Your surgeon will discuss the best course of action to take, with you or your loved ones. FOLLOWING SURGERY Seek immediate medical attention if you have:  Increasing abdominal pain, repeated vomiting, dehydration or fainting.   Severe weakness, chest pain or back pain.   Blood in your vomit, stool or you have tarry stool.  Document Released: 06/22/2003 Document Revised: 03/21/2011 Document Reviewed: 11/20/2007 [facility] Patient Information 2012 [place], [place].CCS      [facility], [place] [phone]  OPEN ABDOMINAL SURGERY: POST OP INSTRUCTIONS  Always review your discharge instruction sheet given to you by the facility where your surgery was performed.  IF YOU HAVE DISABILITY OR FAMILY LEAVE FORMS, YOU MUST BRING THEM TO THE OFFICE FOR PROCESSING.  PLEASE DO NOT GIVE THEM TO YOUR DOCTOR.  1. A prescription for pain medication may be given to you upon discharge.   Take your pain medication as prescribed, if needed.  If narcotic pain medicine is not needed, then you may take acetaminophen (Tylenol) or ibuprofen (Advil) as needed. 2. Take your usually prescribed medications unless otherwise directed. 3. If you need a refill on your pain medication, please contact your pharmacy. They will contact our office to request authorization.  Prescriptions will not be filled after 5pm or on week-ends. 4. You should follow a light diet the first few days after arrival home, such as soup and crackers, pudding, etc.unless your doctor has advised otherwise. A high-fiber, low fat diet can be resumed as tolerated.   Be sure to include lots of fluids daily. Most patients will experience some swelling and bruising on the chest and neck area.  Ice packs will help.  Swelling and bruising can take several days to resolve 5. Most patients will experience some swelling and bruising in the area of the incision. Ice pack will help. Swelling and bruising can take several days to resolve..  6. It is common to experience some constipation if taking pain medication after surgery.  Increasing fluid intake and taking a stool softener will usually help or prevent this problem from occurring.  A mild laxative (Milk of Magnesia or  Miralax) should be taken according to package directions if there are no bowel movements after 48 hours. 7.  You may have steri-strips (small skin tapes) in place directly over the incision.  These strips should be left on the skin for 7-10 days.  If your surgeon used skin glue on the incision, you may shower in 24 hours.  The glue will flake off over the next 2-3 weeks.  Any sutures or staples will be removed at the office during your follow-up visit. You may find that a light gauze bandage over your incision may keep your staples from being rubbed or pulled. You may shower and replace the bandage daily. 8. ACTIVITIES:  You may resume regular (light) daily activities beginning  the next day--such as daily self-care, walking, climbing stairs--gradually increasing activities as tolerated.  You may have sexual intercourse when it is comfortable.  Refrain from any heavy lifting or straining until approved by your doctor. a. You may drive when you no longer are taking prescription pain medication, you can comfortably wear a seatbelt, and you can safely maneuver your car and apply brakes b. Return to Work: ___________________________________ 9. You should see your doctor in the office for a follow-up appointment approximately two weeks after your surgery.  Make sure that you call for this appointment within a day or two after you arrive home to insure a convenient appointment time. OTHER INSTRUCTIONS:  _____________________________________________________________ _____________________________________________________________  WHEN TO CALL YOUR DOCTOR: 1. Fever over 101.0 2. Inability to urinate 3. Nausea and/or vomiting 4. Extreme swelling or bruising 5. Continued bleeding from incision. 6. Increased pain, redness, or drainage from the incision. 7. Difficulty swallowing or breathing 8. Muscle cramping or spasms. 9. Numbness or tingling in hands or feet or around lips.  The clinic staff is available to answer your questions during regular business hours.  Please don't hesitate to call and ask to speak to one of the nurses if you have concerns.  For further questions, please visit [email or web address]  Low Fiber and Residue Restricted Diet A low fiber diet restricts foods that contain carbohydrates that are not digested in the small intestine. A diet containing about 10 g of fiber is considered low fiber. The diet needs to be individualized to suit patient tolerances and preferences and to avoid unnecessary restrictions. Generally, the foods emphasized in a low fiber diet have no skins or seeds. They may have been processed to remove bran, germ, or husks. Cooking  may not necessarily eliminate the fiber. Cooking may, in fact, enable a greater quantity of fiber to be consumed in a lesser volume. Legumes and nuts are also restricted. The term low residue has also been used to describe low fiber diets, although the two are not the same. Residue refers to any substance that adds to bowel (colonic) contents, such as sloughed cells and intestinal bacteria, in addition to fiber. Residue-containing foods, prunes and prune juice, milk, and connective tissue from meats may also need to be eliminated. It is important to eliminate these foods during sudden (acute) attacks of inflammatory bowel disease, when there is a partial obstruction due to another reason, or when minimal fecal output is desired. When these problems are gone, a more normal diet may be used. PURPOSE  Prevent blockage of a partially obstructed or narrowed gastrointestinal tract.   Reduce stool weight and volume.   Slow the movement of waste.  WHEN IS THIS DIET USED?  Acute phase of Crohn's disease, ulcerative colitis, regional enteritis, or diverticulitis.  Narrowing (stenosis) of intestinal or esophageal tubes (lumina).   Transitional diet following surgery, injury (trauma), or illness.  ADEQUACY This diet is nutritionally adequate based on individual food choices according to the Recommended Dietary Allowances of the [employer]. CHOOSING FOODS Check labels, especially on foods from the starch list. Often, dietary fiber content is listed with the Nutrition Facts panel.  Breads and Starches  Allowed: White, [place], and pita breads, plain rolls, buns, or sweet rolls, doughnuts, waffles, pancakes, bagels. Plain muffins, sweet breads, biscuits, matzoth. Flour. Soda, saltine, or graham crackers. Pretzels, rusks, melba toast, zwieback. Cooked cereals: cornmeal, farina, cream cereals. Dry cereals: refined corn, wheat, rice, and oat cereals (check label). Potatoes prepared any way without  skins, refined macaroni, spaghetti, noodles, refined rice.   Avoid: Bread, rolls, or crackers made with whole-wheat, multigrains, rye, bran seeds, nuts, or coconut. Corn tortillas, table-shells. Corn chips, tortilla chips. Cereals containing whole-grains, multigrains, bran, coconut, nuts, or raisins. Cooked or dry oatmeal. Coarse wheat cereals, granola. Cereals advertised as "high fiber." Potato skins. Whole-grain pasta, wild or brown rice. Popcorn.  Vegetables  Allowed:  Strained tomato and vegetable juices. Fresh: tender lettuce, cucumber, cabbage, spinach, bean sprouts. Cooked, canned: asparagus, bean sprouts, cut green or wax beans, cauliflower, pumpkin, beets, mushrooms, olives, spinach, yellow squash, tomato, tomato sauce (no seeds), zucchini (peeled), turnips. Canned sweet potatoes. Small amounts of celery, onion, radish, and green pepper may be used. Keep servings limited to  cup.   Avoid: Fresh, cooked, or canned: artichokes, baked beans, beet greens, broccoli, Brussels sprouts, French-style green beans, corn, kale, legumes, peas, sweet potatoes. Cooked: green or red cabbage, spinach. Avoid large servings of any vegetables.  Fruit  Allowed:  All fruit juices except prune juice. Cooked or canned: apricots applesauce, cantaloupe, cherries, grapefruit, grapes, kiwi, mandarin oranges, peaches, pears, fruit cocktail, pineapple, plums, watermelon. Fresh: banana, grapes, cantaloupe, avocado, cherries, pineapple, grapefruit, kiwi, nectarines, peaches, oranges, blueberries, plums. Keep servings limited to  cup or 1 piece.   Avoid: Fresh: apple with or without skin, apricots, mango, pears, raspberries, strawberries. Prune juice, stewed or dried prunes. Dried fruits, raisins, dates. Avoid large servings of all fresh fruits.  Meat and Meat Substitutes  Allowed:  Ground or well-cooked tender beef, ham, veal, lamb, pork, or poultry. Eggs, plain cheese. Fish, oysters, shrimp, lobster, other seafood.  Liver, organ meats.   Avoid: Tough, fibrous meats with gristle. Peanut butter, smooth or chunky. Cheese with seeds, nuts, or other foods not allowed. Nuts, seeds, legumes, dried peas, beans, lentils.  Milk  Allowed:  All milk products except those not allowed. Milk and milk product consumption should be minimal when low residue is desired.   Avoid: Yogurt that contains nuts or seeds.  Soups and Combination Foods  Allowed:  Bouillon, broth, or cream soups made from allowed foods. Any strained soup. Casseroles or mixed dishes made with allowed foods.   Avoid: Soups made from vegetables that are not allowed or that contain other foods not allowed.  Desserts and Sweets  Allowed:  Plain cakes and cookies, pie made with allowed fruit, pudding, custard, cream pie. Gelatin, fruit, ice, sherbet, frozen ice pops. Ice cream, ice milk without nuts. Plain hard candy, honey, jelly, molasses, syrup, sugar, chocolate syrup, gumdrops, marshmallows.   Avoid: Desserts, cookies, or candies that contain nuts, peanut butter, or dried fruits. Jams, preserves with seeds, marmalade.  Fats and Oils  Allowed:  Margarine, butter, cream, mayonnaise, salad oils, plain salad dressings made from allowed foods. Plain gravy, crisp  bacon without rind.   Avoid: Seeds, nuts, olives. Avocados.  Beverages  Allowed:  All, except those listed to avoid.   Avoid: Fruit juices with high pulp, prune juice.  Condiments  Allowed:  Ketchup, mustard, horseradish, vinegar, cream sauce, cheese sauce, cocoa powder. Spices in moderation: allspice, basil, bay leaves, celery powder or leaves, cinnamon, cumin powder, curry powder, ginger, mace, marjoram, onion or garlic powder, oregano, paprika, parsley flakes, ground pepper, rosemary, sage, savory, tarragon, thyme, turmeric.   Avoid: Coconut, pickles.  SAMPLE MEAL PLAN The following menu is provided as a sample. Your daily menu plans will vary. Be sure to include a minimum of the  following each day in order to provide essential nutrients for the adult:  Starch/Bread/Cereal Group, 6 servings.   Fruit/Vegetable Group, 5 servings.   Meat/Meat Substitute Group, 2 servings.   Milk/Milk Substitute Group, 2 servings.  A serving is equal to  cup for fruits, vegetables, and cooked cereals or 1 piece for foods such as a piece of bread, 1 orange, or 1 apple. For dry cereals and crackers, use serving sizes listed on the label. Combination foods may count as full or partial servings from various food groups. Fats, desserts, and sweets may be added to the meal plan after the requirements for essential nutrients are met. SAMPLE MENU Breakfast   cup orange juice.   1 boiled egg.   1 slice white toast.   Margarine.    cup cornflakes.   1 cup milk.   Beverage.  Lunch   cup chicken noodle soup.   2 to 3 oz sliced roast beef.   2 slices seedless rye bread.   Mayonnaise.    cup tomato juice.   1 small banana.   Beverage.  Dinner  3 oz baked chicken.    cup scalloped potatoes.    cup cooked beets.   White dinner roll.   Margarine.    cup canned peaches.   Beverage.  Document Released: 09/21/2001 Document Revised: 03/21/2011 Document Reviewed: 03/04/2011 [facility] Patient Information 2012 [place], [place].

## 2011-09-27 NOTE — Discharge Summary (Signed)
Physician Discharge Summary  Patient ID: Austin Pittman MRN: [id number] DOB/AGE: April 13, 1916 78 y.o.  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 [facility] Course: patient is a 78 -year-old male who was discharged from the hospital one week ago following an approximately 1 week hospitalization for small bowel obstruction. This resolved with conservative management. He did well for the first 4 or 5 days but about 36 hours ago developed progressive crampy lower abdominal pain then nausea and then last night had frequent vomiting all night with bilious and then feculent vomiting. No bowel movement since the onset. He has continued sharp and crampy pain across his lower abdomen. His only previous surgery is an open appendectomy done over 70 years ago. No fever or chills.  He was readmitted and taken to the OR,09/22/11 by DR. Hoxworth.  He was transferred to the ICU and did well.  He developed confusion and was eventually treated with Haldol.  We mobilized him and he started having BM's.  We advanced his diet and today we are starting him on a low residual diet.  Family choose SNF, for d/c, and he is being followed by OT/PT here who agree..  If he does well with breakfast and lunch we will let him go to SNF later today.  We will have him follow up in the office next week for staple removal, and then 2 weeks to see DR. Hoxworth.  His confusion has gotten much better, and he wants to know why i keep asking where he is.  Condition on D/C:  Improved   Disposition: SNF  Discharge Orders    Future Appointments: Provider: Department: Dept Phone:  Center:   09/27/2011 2:00 PM [facility] [phone] None   10/14/2011 4:40 PM Mariella Saa, MD [facility] [phone] None     Medication List  As of 09/27/2011 11:17 AM   TAKE these medications         acetaminophen 325 MG tablet   Commonly known as: TYLENOL   Take 2 tablets (650 mg total) by mouth every 4 (four) hours as needed for pain.      ibuprofen 200 MG tablet   Commonly known as: ADVIL,MOTRIN   Take 400 mg by mouth every 6 (six) hours as needed. Patient used this medication for pain.      travoprost (benzalkonium) 0.004 % ophthalmic solution   Commonly known as: TRAVATAN   Place 1 drop into both eyes at bedtime.           Follow-up Information    Follow up with Mariella Saa, MD. Schedule an appointment as soon as possible for a visit in 5 days. (for staple removal at the office.)    Contact information:   [facility] [address] [place] [address] [phone]       Follow up with Mariella Saa, MD in 2 weeks. (To see DR. Hoxworth)    Contact information:   [employer] [facility] [address], [address] [place] [address] [phone]       Follow up with Josue Hector, MD in 2 weeks. (Call for follow up  appointment.  Call sooner if you have a problem.)    Contact information:   [address] [place] [address] [phone]          Signed: Sherrie George 09/27/2011, 11:17 AM

## 2011-09-30 ENCOUNTER — Ambulatory Visit (INDEPENDENT_AMBULATORY_CARE_PROVIDER_SITE_OTHER): Payer: Medicare Other | Admitting: General Surgery

## 2011-09-30 VITALS — BP 138/82 | HR 74 | Temp 97.8°F | Resp 18 | Wt 163.4 lb

## 2011-09-30 DIAGNOSIS — Z09 Encounter for follow-up examination after completed treatment for conditions other than malignant neoplasm: Secondary | ICD-10-CM

## 2011-09-30 NOTE — Progress Notes (Signed)
Patient seen today for staple removal, Mr. Austin Pittman has family present, patient reports having frequent loose bowel movements daily.  He states "he's afraid to sleep at night, [name]"  Reviewed with Dr. Michaell Pittman, patient can take Kaopectate b.i.d. Removed staples and placed steri-strips over incision, incision intact, patient tolerated well has a follow up appointment with Dr. [name] 10/14/11.

## 2011-10-01 ENCOUNTER — Telehealth (INDEPENDENT_AMBULATORY_CARE_PROVIDER_SITE_OTHER): Payer: Self-pay

## 2011-10-01 NOTE — Telephone Encounter (Signed)
Left message to call our office for pathology results:  Negative for carcinoma

## 2011-10-14 ENCOUNTER — Encounter (INDEPENDENT_AMBULATORY_CARE_PROVIDER_SITE_OTHER): Payer: Self-pay | Admitting: General Surgery

## 2011-10-14 ENCOUNTER — Ambulatory Visit (INDEPENDENT_AMBULATORY_CARE_PROVIDER_SITE_OTHER): Payer: Medicare Other | Admitting: General Surgery

## 2011-10-14 VITALS — BP 128/68 | HR 85 | Temp 98.9°F | Ht 69.0 in | Wt 164.2 lb

## 2011-10-14 DIAGNOSIS — Z09 Encounter for follow-up examination after completed treatment for conditions other than malignant neoplasm: Secondary | ICD-10-CM

## 2011-10-14 NOTE — Progress Notes (Signed)
History: Patient returns for his first postop check following laparoscopic-assisted ileocecectomy for small bowel obstruction. He had recurrent bowel obstruction and and at the time of surgery Was found to have a markedly thickened segment of terminal ileum. His postoperative course was smooth with a one-week hospitalization in one week rehabilitation stay and he has been at home for a week and gradually improving. His chief complaint is frequent diarrhea. He was checked for C. difficile in rehabilitation which was negative. He is somewhat weak and has lost some weight but has been working in his garden and then active.  Exam: BP 128/68  Pulse 85  Temp 98.9 F (37.2 C) (Temporal)  Ht 5\' 9"  (1.753 m)  Wt 164 lb 3.2 oz (74.481 kg)  BMI 24.25 kg/m2  SpO2 97%  Gen. does not appear ill Abdomen: Soft and nontender. 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  Assessment plan: Doing well following ileocecectomy. He will use Imodium as needed for the diarrhea. We discussed avoiding NSAID use. Crohn's disease I think is still in the differential but he has had complete resection at age 78 I would not recommend medication or GI referral. We discussed the tubulovillous adenoma and he does not want a colonoscopy which I think again at his age is reasonable. He will return for followup in one month.

## 2011-10-14 NOTE — Patient Instructions (Signed)
Start Imodium one to 2 up to 4 times per day as needed for diarrhea  No diet or physical limitations  Avoid ibuprofen or other NSAID use.

## 2011-10-30 ENCOUNTER — Telehealth: Payer: Self-pay | Admitting: Internal Medicine

## 2011-10-30 NOTE — Telephone Encounter (Signed)
Had surgery in June since,then his pulse has stayed in 80'S.   Stays weak and no energy  per daughter.

## 2011-10-30 NOTE — Telephone Encounter (Signed)
lmom for daughter that Dr Johney Frame is in the [place] office tomorrow and has two openings and can be seen if she feels it necessary.  They can call the [facility] office as this is where he is seen and followed   He has not been seen in the [place] office

## 2011-10-31 ENCOUNTER — Other Ambulatory Visit: Payer: Self-pay | Admitting: Internal Medicine

## 2011-10-31 ENCOUNTER — Telehealth: Payer: Self-pay | Admitting: *Deleted

## 2011-10-31 ENCOUNTER — Encounter: Payer: Self-pay | Admitting: Internal Medicine

## 2011-10-31 ENCOUNTER — Ambulatory Visit (INDEPENDENT_AMBULATORY_CARE_PROVIDER_SITE_OTHER): Payer: Medicare Other | Admitting: Internal Medicine

## 2011-10-31 VITALS — BP 136/83 | HR 104 | Ht 69.0 in | Wt 164.4 lb

## 2011-10-31 DIAGNOSIS — R0602 Shortness of breath: Secondary | ICD-10-CM

## 2011-10-31 DIAGNOSIS — I4891 Unspecified atrial fibrillation: Secondary | ICD-10-CM

## 2011-10-31 DIAGNOSIS — K56609 Unspecified intestinal obstruction, unspecified as to partial versus complete obstruction: Secondary | ICD-10-CM

## 2011-10-31 DIAGNOSIS — R609 Edema, unspecified: Secondary | ICD-10-CM

## 2011-10-31 DIAGNOSIS — I441 Atrioventricular block, second degree: Secondary | ICD-10-CM

## 2011-10-31 DIAGNOSIS — R5381 Other malaise: Secondary | ICD-10-CM

## 2011-10-31 DIAGNOSIS — R5383 Other fatigue: Secondary | ICD-10-CM

## 2011-10-31 DIAGNOSIS — R000 Tachycardia, unspecified: Secondary | ICD-10-CM

## 2011-10-31 NOTE — Telephone Encounter (Signed)
Appointment has already been scheduled for today at 11:15.

## 2011-10-31 NOTE — Telephone Encounter (Signed)
Spoke with Dr. Johney Frame and gave results of labs. MD orded 2 D Echo for tomorrow at hosptial, Increase dietary K+ and drink more water. MD request a copy of labs be sent to PCP. Labs faxed to [facility] office. Daughter informed that labs are okay and patient needed to drink more water and increase K+ in diet. Daughter informed that they would be receiving a call from [facility] with information about echo appointment.

## 2011-10-31 NOTE — Patient Instructions (Addendum)
   Please keep scheduled appointment with Dr. Johney Frame.  Please make an appointment with Dr. Lysbeth Galas. Your physician recommends that you return for lab work in: today at [facility]. Your physician recommends that you continue on your current medications as directed. Please refer to the Current Medication list given to you today.

## 2011-11-01 DIAGNOSIS — I4891 Unspecified atrial fibrillation: Secondary | ICD-10-CM

## 2011-11-01 LAB — PACEMAKER DEVICE OBSERVATION
AL AMPLITUDE: 3 mv
ATRIAL PACING PM: 16
BAMS-0001: 150 {beats}/min
DEVICE MODEL PM: 2328745
RV LEAD THRESHOLD: 0.5 V
VENTRICULAR PACING PM: 97

## 2011-11-01 NOTE — Telephone Encounter (Signed)
Informed daughter Olegario Messier.

## 2011-11-04 ENCOUNTER — Encounter: Payer: Self-pay | Admitting: Internal Medicine

## 2011-11-04 DIAGNOSIS — R000 Tachycardia, unspecified: Secondary | ICD-10-CM | POA: Insufficient documentation

## 2011-11-04 DIAGNOSIS — R609 Edema, unspecified: Secondary | ICD-10-CM | POA: Insufficient documentation

## 2011-11-04 NOTE — Assessment & Plan Note (Signed)
The patient has a mild sinus tachycardia (HR 100 bpm).  His pacemaker is interrogated today and is functioning normally. I suspect that he is dehydrated given his poor appetite, recent diarrhea, and extreme temperature exposure at home. I will check BMET, CBC, TFTs today to evaluate for metabolic causes of tachycardia. In addition, I had a long discussion with the patient and his family about the importance of adequate hydration, PO intake, and avoidance of the heat.  I have encouraged him to use his AC. He should follow-up with Dr Lysbeth Galas if not improved.

## 2011-11-04 NOTE — Assessment & Plan Note (Signed)
S/p recent surgery Given ongoing nausea and decreased PO intake, I have encouraged the patient to follow-up with Dr Johna Sheriff if not improved.

## 2011-11-04 NOTE — Progress Notes (Signed)
PCP:  Josue Hector, MD  The patient presents today for cardiology followup and evaluation of tachycardia.  Since last being seen in our clinic, the patient has had surgery and subsequent rehab.  He is s/p laparoscopic-assisted ileocecectomy for small bowel obstruction.  He has been followed closely by surgery.  He developed post operative diarrhea with significant fluid loss.  In addition, he reports nausea frequently with decreased appetite.  He is accompanied by 3 daughters today who are quite concerned.  They note that he has not been eating adequately.  In addition, he has not been using his are condition at home.  His daughter states that the temperature in the house was 86 degrees today. His family has noticed that his heart rate has been elevated for several days.  For this reason, he presents to me today to have his pacemaker evaluation.   Today, he denies symptoms of palpitations, chest pain, shortness of breath, orthopnea, PND, dizziness, presyncope, syncope, or neurologic sequela.  He has had progressive BLE edema since his recent hospitalization and rehab stay.  The patient feels that he is tolerating medications without difficulties and is otherwise without complaint today.   Past Medical History  Diagnosis Date  . Atrial tachycardia     nonsustained  . Hiatal hernia   . 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 Glaucoma   . Second degree Mobitz II AV block     s/p PPM   . Macular degeneration 09/12/2011  . Acute confusion due to known medical condition 09/12/2011  . Diarrhea    Past Surgical History  Procedure Date  . Appendectomy   . Pacemaker insertion 11/07/08    St Jude implanted by Dr Johney Frame  . Laparoscopy 09/22/2011    Procedure: LAPAROSCOPY DIAGNOSTIC;  Surgeon: Mariella Saa, MD;  Location: [facility];  Service: General;  Laterality: N/A;  resectiion terminal ileum cecum with anastamosis    Current Outpatient Prescriptions    Medication Sig Dispense Refill  . acetaminophen (TYLENOL) 325 MG tablet Take 2 tablets (650 mg total) by mouth every 4 (four) hours as needed for pain.      Marland Kitchen travoprost, benzalkonium, (TRAVATAN) 0.004 % ophthalmic solution Place 1 drop into both eyes at bedtime.         No Known Allergies  History   Social History  . Marital Status: Widowed    Spouse Name: N/A    Number of Children: 2  . Years of Education: N/A   Occupational History  . Not on file.   Social History Main Topics  . Smoking status: Former Smoker -- 1.0 packs/day for 20 years    Types: Cigarettes    Quit date: 04/15/1946  . Smokeless tobacco: Former Neurosurgeon    Types: Chew    Quit date: 04/15/1948   Comment: chewed tobacco for 2 years  . Alcohol Use: No  . Drug Use: No  . Sexually Active: Not on file   Other Topics Concern  . Not on file   Social History Narrative  . No narrative on file    Family History  Problem Relation Age of Onset  . Hypertension Mother   . Heart disease Father     ROS-  All systems are reviewed and are negative except as outlined in the HPI above   Physical Exam: Filed Vitals:   10/31/11 1105  BP: 136/83  Pulse: 104  Height: 5\' 9"  (1.753 m)  Weight: 164 lb  6.4 oz (74.571 kg)  SpO2: 95%    GEN- The patient is elderly appearing, alert and oriented x 3 today.   Head- normocephalic, atraumatic Eyes-  Sclera clear, conjunctiva pink Ears- hearing intact Oropharynx- clear Neck- supple, no JVD Lungs- Clear to ausculation bilaterally, normal work of breathing Chest- pacemaker pocket is well healed Heart- mildly tachycardic regular rhythm, no murmurs, rubs or gallops, PMI not laterally displaced GI- soft, NT, ND, + BS Extremities- no clubbing, cyanosis, 1+ BLE edema MS- age appropriate atrophy Skin- no rash or lesion Psych- euthymic mood, full affect Neuro- strength and sensation are intact  Pacemaker interrogation- reviewed in detail today,  See PACEART report EKG  today reveals sinus tachycardia 100 bpm, PR 192, V paced  Assessment and Plan:

## 2011-11-04 NOTE — Assessment & Plan Note (Signed)
Normal pacemaker function See Arita Miss Art report No changes today  There has been some noise on his ventricular lead since last interrogation, however the lead function appears preserved.    He is not pacemaker dependant today.  We will follow this closely.

## 2011-11-04 NOTE — Assessment & Plan Note (Signed)
Likely due to venous stasis with recent decrease in mobility.  His edema is symmetric.  There are no cords/ homans.  My suspicion for DVT is low. I will obtain an echo.  He will follow closely with Dr Lysbeth Galas. He will return for further cardiology care pending the results of his echo. Given his advanced age and recent medical issues, I think that overall a conservative approach is appropriate

## 2011-11-05 ENCOUNTER — Telehealth: Payer: Self-pay | Admitting: *Deleted

## 2011-11-05 NOTE — Telephone Encounter (Signed)
Message copied by Eustace Moore on Tue Nov 05, 2011  4:15 PM ------      Message from: Hillis Range      Created: Mon Nov 04, 2011  9:28 PM       Please inform patient of echo result.      EF is mildly depressed.      Please schedule follow-up with Gene Serpe or one of the [facility] providers within 2-3 weeks.

## 2011-11-05 NOTE — Telephone Encounter (Signed)
Daughter Olegario Messier informed and given an appointment with Gene on 12/02/11 @2 :20pm.

## 2011-11-20 ENCOUNTER — Encounter (INDEPENDENT_AMBULATORY_CARE_PROVIDER_SITE_OTHER): Payer: Self-pay | Admitting: General Surgery

## 2011-11-20 ENCOUNTER — Ambulatory Visit (INDEPENDENT_AMBULATORY_CARE_PROVIDER_SITE_OTHER): Payer: Medicare Other | Admitting: General Surgery

## 2011-11-20 VITALS — BP 108/70 | HR 71 | Temp 97.4°F | Resp 16 | Ht 69.0 in | Wt 163.6 lb

## 2011-11-20 DIAGNOSIS — K56609 Unspecified intestinal obstruction, unspecified as to partial versus complete obstruction: Secondary | ICD-10-CM

## 2011-11-20 DIAGNOSIS — K56699 Other intestinal obstruction unspecified as to partial versus complete obstruction: Secondary | ICD-10-CM

## 2011-11-20 NOTE — Progress Notes (Signed)
History: Patient returns for followup now 8 weeks following laparotomy and ileocecectomy for small bowel obstruction with a narrow inflamed segment of terminal ileum. Upon the pathology was nondiagnostic for Crohn's. In the Campbell defect was suggested although he was taking minimal occasional Advil preoperatively. He continues to feel somewhat poorly. He has persistent ongoing nausea. This is present when he wakes up in the morning and is worse when he tries to eat. He is not having vomiting or abdominal pain. He had some diarrhea which is not unexpected after his surgery. His energy level is very poor. He had a recent echocardiogram showing mildly reduced ejection fraction and has a cardiology appointment scheduled.  Exam: BP 108/70  Pulse 71  Temp 97.4 F (36.3 C) (Temporal)  Resp 16  Ht 5\' 9"  (1.753 m)  Wt 163 lb 9.6 oz (74.208 kg)  BMI 24.16 kg/m2  General: Elderly white male who does not appear acutely ill Abdomen: Well-healed midline incision. Soft and nontender. No distention. No masses or organomegaly.  Assessment plan: Status post ileocecectomy for an inflamed strictured area of terminal ileum questionable etiology. He does not have obstructive symptoms currently but has persistent nausea and lack of appetite and weakness. He did have a positive H. pylori antibody. I'm going to check a breast tech to see if he might have an acute infection. I am also going to repeat a CT scan abdomen and pelvis to make sure he does not have low-grade obstruction or other new areas of abnormal bowel. I called him back after the studies. He may well benefit from a GI referral unless the studies are diagnostic.

## 2011-11-22 ENCOUNTER — Ambulatory Visit
Admission: RE | Admit: 2011-11-22 | Discharge: 2011-11-22 | Disposition: A | Discharge status: Home / Self Care | Payer: Medicare Other | Source: Ambulatory Visit | Attending: General Surgery | Admitting: General Surgery

## 2011-11-22 ENCOUNTER — Telehealth (INDEPENDENT_AMBULATORY_CARE_PROVIDER_SITE_OTHER): Payer: Self-pay

## 2011-11-22 DIAGNOSIS — K56609 Unspecified intestinal obstruction, unspecified as to partial versus complete obstruction: Secondary | ICD-10-CM

## 2011-11-22 DIAGNOSIS — K56699 Other intestinal obstruction unspecified as to partial versus complete obstruction: Secondary | ICD-10-CM

## 2011-11-22 MED ORDER — IOHEXOL 300 MG/ML  SOLN
100.0000 mL | Freq: Once | INTRAMUSCULAR | Status: AC | PRN
  Administered 2011-11-22: 100 mL via INTRAVENOUS

## 2011-11-27 ENCOUNTER — Telehealth (INDEPENDENT_AMBULATORY_CARE_PROVIDER_SITE_OTHER): Payer: Self-pay | Admitting: General Surgery

## 2011-11-27 NOTE — Telephone Encounter (Signed)
Calling for CT results. Please call daughter, Olegario Messier, back. [phone].

## 2011-11-29 ENCOUNTER — Encounter: Payer: Self-pay | Admitting: Gastroenterology

## 2011-11-29 ENCOUNTER — Other Ambulatory Visit (INDEPENDENT_AMBULATORY_CARE_PROVIDER_SITE_OTHER): Payer: Self-pay

## 2011-11-29 DIAGNOSIS — R109 Unspecified abdominal pain: Secondary | ICD-10-CM

## 2011-11-29 DIAGNOSIS — K529 Noninfective gastroenteritis and colitis, unspecified: Secondary | ICD-10-CM

## 2011-11-29 DIAGNOSIS — K56609 Unspecified intestinal obstruction, unspecified as to partial versus complete obstruction: Secondary | ICD-10-CM

## 2011-11-29 DIAGNOSIS — R112 Nausea with vomiting, unspecified: Secondary | ICD-10-CM

## 2011-11-30 ENCOUNTER — Encounter (HOSPITAL_COMMUNITY): Payer: Self-pay | Admitting: Emergency Medicine

## 2011-11-30 ENCOUNTER — Emergency Department (HOSPITAL_COMMUNITY): Payer: Medicare Other

## 2011-11-30 ENCOUNTER — Emergency Department (HOSPITAL_COMMUNITY)
Admission: EM | Admit: 2011-11-30 | Discharge: 2011-11-30 | Disposition: A | Discharge status: Home / Self Care | Payer: Medicare Other | Attending: Emergency Medicine | Admitting: Emergency Medicine

## 2011-11-30 DIAGNOSIS — R1011 Right upper quadrant pain: Secondary | ICD-10-CM

## 2011-11-30 DIAGNOSIS — R82998 Other abnormal findings in urine: Secondary | ICD-10-CM | POA: Insufficient documentation

## 2011-11-30 DIAGNOSIS — N201 Calculus of ureter: Secondary | ICD-10-CM

## 2011-11-30 DIAGNOSIS — Z87891 Personal history of nicotine dependence: Secondary | ICD-10-CM | POA: Insufficient documentation

## 2011-11-30 DIAGNOSIS — K8020 Calculus of gallbladder without cholecystitis without obstruction: Secondary | ICD-10-CM | POA: Insufficient documentation

## 2011-11-30 DIAGNOSIS — Z9089 Acquired absence of other organs: Secondary | ICD-10-CM | POA: Insufficient documentation

## 2011-11-30 DIAGNOSIS — R319 Hematuria, unspecified: Secondary | ICD-10-CM

## 2011-11-30 LAB — CBC WITH DIFFERENTIAL/PLATELET
Eosinophils Absolute: 0.2 10*3/uL (ref 0.0–0.7)
Lymphs Abs: 1 10*3/uL (ref 0.7–4.0)
MCH: 28.9 pg (ref 26.0–34.0)
Neutrophils Relative %: 81 % — ABNORMAL HIGH (ref 43–77)
Platelets: 153 10*3/uL (ref 150–400)
RBC: 4.43 MIL/uL (ref 4.22–5.81)
WBC: 9.4 10*3/uL (ref 4.0–10.5)

## 2011-11-30 LAB — URINALYSIS, ROUTINE W REFLEX MICROSCOPIC
Nitrite: NEGATIVE
Specific Gravity, Urine: 1.029 (ref 1.005–1.030)
pH: 5 (ref 5.0–8.0)

## 2011-11-30 LAB — URINE MICROSCOPIC-ADD ON

## 2011-11-30 LAB — COMPREHENSIVE METABOLIC PANEL
Albumin: 3.4 g/dL — ABNORMAL LOW (ref 3.5–5.2)
Alkaline Phosphatase: 62 U/L (ref 39–117)
BUN: 16 mg/dL (ref 6–23)
Potassium: 3.3 mEq/L — ABNORMAL LOW (ref 3.5–5.1)
Total Protein: 6.9 g/dL (ref 6.0–8.3)

## 2011-11-30 LAB — LIPASE, BLOOD: Lipase: 15 U/L (ref 11–59)

## 2011-11-30 MED ORDER — ONDANSETRON HCL 4 MG/2ML IJ SOLN
4.0000 mg | Freq: Once | INTRAMUSCULAR | Status: AC
  Administered 2011-11-30: 4 mg via INTRAVENOUS
  Filled 2011-11-30: qty 2

## 2011-11-30 MED ORDER — MORPHINE SULFATE 4 MG/ML IJ SOLN
4.0000 mg | Freq: Once | INTRAMUSCULAR | Status: AC
  Administered 2011-11-30: 4 mg via INTRAVENOUS
  Filled 2011-11-30: qty 1

## 2011-11-30 MED ORDER — SODIUM CHLORIDE 0.9 % IV SOLN
INTRAVENOUS | Status: DC
  Administered 2011-11-30: 14:00:00 via INTRAVENOUS

## 2011-11-30 MED ORDER — DOCUSATE SODIUM 100 MG PO CAPS: 100.0000 mg | ORAL_CAPSULE | Freq: Two times a day (BID) | ORAL | Status: DC | PRN

## 2011-11-30 MED ORDER — ONDANSETRON 8 MG PO TBDP: 8.0000 mg | ORAL_TABLET | Freq: Two times a day (BID) | ORAL | Status: DC | PRN

## 2011-11-30 MED ORDER — HYDROCODONE-ACETAMINOPHEN 5-325 MG PO TABS: ORAL_TABLET | ORAL | Status: DC

## 2011-11-30 NOTE — ED Notes (Signed)
Awaiting CT, eta 20 minutes; family at bedside

## 2011-11-30 NOTE — ED Notes (Signed)
Family at bedside. 

## 2011-11-30 NOTE — Discharge Instructions (Signed)
Kidney Stones Kidney stones (ureteral lithiasis) are deposits that form inside your kidneys. The intense pain is caused by the stone moving through the urinary tract. When the stone moves, the ureter goes into spasm around the stone. The stone is usually passed in the urine.  CAUSES   A disorder that makes certain neck glands produce too much parathyroid hormone (primary hyperparathyroidism).   A buildup of uric acid crystals.   Narrowing (stricture) of the ureter.   A kidney obstruction present at birth (congenital obstruction).   Previous surgery on the kidney or ureters.   Numerous kidney infections.  SYMPTOMS   Feeling sick to your stomach (nauseous).   Throwing up (vomiting).   Blood in the urine (hematuria).   Pain that usually spreads (radiates) to the groin.   Frequency or urgency of urination.  DIAGNOSIS   Taking a history and physical exam.   Blood or urine tests.   Computerized X-ray scan (CT scan).   Occasionally, an examination of the inside of the urinary bladder (cystoscopy) is performed.  TREATMENT   Observation.   Increasing your fluid intake.   Surgery may be needed if you have severe pain or persistent obstruction.  The size, location, and chemical composition are all important variables that will determine the proper choice of action for you. Talk to your caregiver to better understand your situation so that you will minimize the risk of injury to yourself and your kidney.  HOME CARE INSTRUCTIONS   Drink enough water and fluids to keep your urine clear or pale yellow.   Strain all urine through the provided strainer. Keep all particulate matter and stones for your caregiver to see. The stone causing the pain may be as small as a grain of salt. It is very important to use the strainer each and every time you pass your urine. The collection of your stone will allow your caregiver to analyze it and verify that a stone has actually passed.   Only take  over-the-counter or prescription medicines for pain, discomfort, or fever as directed by your caregiver.   Make a follow-up appointment with your caregiver as directed.   Get follow-up X-rays if required. The absence of pain does not always mean that the stone has passed. It may have only stopped moving. If the urine remains completely obstructed, it can cause loss of kidney function or even complete destruction of the kidney. It is your responsibility to make sure X-rays and follow-ups are completed. Ultrasounds of the kidney can show blockages and the status of the kidney. Ultrasounds are not associated with any radiation and can be performed easily in a matter of minutes.  SEEK IMMEDIATE MEDICAL CARE IF:   Pain cannot be controlled with the prescribed medicine.   You have a fever.   The severity or intensity of pain increases over 18 hours and is not relieved by pain medicine.   You develop a new onset of abdominal pain.   You feel faint or pass out.  MAKE SURE YOU:   Understand these instructions.   Will watch your condition.   Will get help right away if you are not doing well or get worse.  Document Released: 04/01/2005 Document Revised: 03/21/2011 Document Reviewed: 07/28/2009 Exit[employer] Patient Information 2012 [employer].    Narcotic and benzodiazepine use may cause drowsiness, slowed breathing or dependence.  Please use with caution and do not drive, operate machinery or watch young children alone while taking them.  Taking combinations of these   medications or drinking alcohol will potentiate these effects.    

## 2011-11-30 NOTE — ED Provider Notes (Signed)
Distal punctate ureteral stone noted on CT, results discussed with pt and family.  Incidental findings including aneurysmal dilation of aorta noted and told to pt and family encouraged follow up with PCP for symptoms and to discuss further.  Norco, zofran, colace prescribed.  Gavin Pound. Oletta Lamas, MD 11/30/11 Rickey Primus

## 2011-11-30 NOTE — ED Notes (Signed)
US at bedside

## 2011-11-30 NOTE — ED Provider Notes (Signed)
History     CSN: [id number]  Arrival date & time 11/30/11  1255   First MD Initiated Contact with Patient 11/30/11 1329      Chief Complaint  Patient presents with  . Flank Pain    (Consider location/radiation/quality/duration/timing/severity/associated sxs/prior treatment) HPI  Patient started having pain in his right flank yesterday and today the pain is in his right lateral abdomen. He denies nausea, vomiting, hematuria. He states he's having difficulty urinating and that he is only going small amounts at a time. He also has some mild burning or dysuria. He is concerned he may have a bowel obstruction. He relates he had a bowel obstruction and had to have surgery in June.   Family reports he has gallstones on prior CT scan. Patient states he also drinks a lot of milk but not a lot of caffeine.  PCP Dr Lysbeth Galas  Past Medical History  Diagnosis Date  . Atrial tachycardia     nonsustained  . Hiatal hernia   . 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 Glaucoma   . Second degree Mobitz II AV block     s/p PPM   . Macular degeneration 09/12/2011  . Acute confusion due to known medical condition 09/12/2011  . Diarrhea     Past Surgical History  Procedure Date  . Appendectomy   . Pacemaker insertion 11/07/08    St Jude implanted by Dr Johney Frame  . Laparoscopy 09/22/2011    Procedure: LAPAROSCOPY DIAGNOSTIC;  Surgeon: Mariella Saa, MD;  Location: [facility];  Service: General;  Laterality: N/A;  resectiion terminal ileum cecum with anastamosis  . Colon surgery June, 21    SBO    Family History  Problem Relation Age of Onset  . Hypertension Mother   . Heart disease Father     History  Substance Use Topics  . Smoking status: Former Smoker -- 1.0 packs/day for 20 years    Types: Cigarettes    Quit date: 04/15/1946  . Smokeless tobacco: Former Neurosurgeon    Types: Chew    Quit date: 04/15/1948   Comment: chewed tobacco for 2 years  . Alcohol Use:  No  lives alone    Review of Systems  All other systems reviewed and are negative.    Allergies  Review of patient's allergies indicates no known allergies.  Home Medications   Current Outpatient Rx  Name Route Sig Dispense Refill  . ACETAMINOPHEN 500 MG PO TABS Oral Take 500 mg by mouth every 6 (six) hours as needed. Pain    . FERROUS SULFATE 325 (65 FE) MG PO TABS Oral Take 325 mg by mouth 3 (three) times daily.    Marland Kitchen LOPERAMIDE HCL 2 MG PO CAPS Oral Take 4 mg by mouth as needed. diarrhea    . ONDANSETRON HCL 4 MG PO TABS Oral Take 4 mg by mouth as needed. Nausea    . TRAVOPROST 0.004 % OP SOLN Both Eyes Place 1 drop into both eyes at bedtime.       BP 152/66  Pulse 83  Temp 98.7 F (37.1 C) (Oral)  Resp 21  SpO2 97%  Vital signs normal except hypertension   Physical Exam  Nursing note and vitals reviewed. Constitutional: He is oriented to person, place, and time. He appears well-developed and well-nourished.  Non-toxic appearance. He does not appear ill. No distress.  HENT:  Head: Normocephalic and atraumatic.  Right Ear: External ear normal.  Left Ear:  External ear normal.  Nose: Nose normal. No mucosal edema or rhinorrhea.  Mouth/Throat: Oropharynx is clear and moist and mucous membranes are normal. No dental abscesses or uvula swelling.  Eyes: Conjunctivae and EOM are normal. Pupils are equal, round, and reactive to light.       Eyelid margins red  Neck: Normal range of motion and full passive range of motion without pain. Neck supple.  Cardiovascular: Normal rate, regular rhythm and normal heart sounds.  Exam reveals no gallop and no friction rub.   No murmur heard. Pulmonary/Chest: Effort normal and breath sounds normal. No respiratory distress. He has no wheezes. He has no rhonchi. He has no rales. He exhibits no tenderness and no crepitus.  Abdominal: Soft. Normal appearance and bowel sounds are normal. He exhibits no distension. There is no tenderness. There  is no rebound and no guarding.         PT has a well healed midline scar above the umbulicus from his most recent surgery, also has an old linear appy scar.   Musculoskeletal: Normal range of motion. He exhibits no edema and no tenderness.       Moves all extremities well.   Neurological: He is alert and oriented to person, place, and time. He has normal strength. No cranial nerve deficit.  Skin: Skin is warm, dry and intact. No rash noted. No erythema. No pallor.  Psychiatric: He has a normal mood and affect. His speech is normal and behavior is normal. His mood appears not anxious.    ED Course  Procedures (including critical care time)   Medications  0.9 %  sodium chloride infusion (  Intravenous New Bag/Given 11/30/11 1421)  morphine 4 MG/ML injection 4 mg (4 mg Intravenous Given 11/30/11 1421)  ondansetron (ZOFRAN) injection 4 mg (4 mg Intravenous Given 11/30/11 1420)     Review of CT scan from 8/7 shows gallstones, no AAA, no obvious renal stones. Pt's CT have all been with contrast.   16:00 When asked about history of gout he and family deny it.   16:25 turned over to Dr Oletta Lamas at change of shift to get CT AP results. CT without contrast done to look for renal stones.   Results for orders placed during the hospital encounter of 11/30/11  CBC WITH DIFFERENTIAL      Component Value Range   WBC 9.4  4.0 - 10.5 K/uL   RBC 4.43  4.22 - 5.81 MIL/uL   Hemoglobin 12.8 (*) 13.0 - 17.0 g/dL   HCT 16.1 (*) 09.6 - 04.5 %   MCV 87.4  78.0 - 100.0 fL   MCH 28.9  26.0 - 34.0 pg   MCHC 33.1  30.0 - 36.0 g/dL   RDW 40.9  81.1 - 91.4 %   Platelets 153  150 - 400 K/uL   Neutrophils Relative 81 (*) 43 - 77 %   Neutro Abs 7.6  1.7 - 7.7 K/uL   Lymphocytes Relative 10 (*) 12 - 46 %   Lymphs Abs 1.0  0.7 - 4.0 K/uL   Monocytes Relative 7  3 - 12 %   Monocytes Absolute 0.6  0.1 - 1.0 K/uL   Eosinophils Relative 2  0 - 5 %   Eosinophils Absolute 0.2  0.0 - 0.7 K/uL   Basophils Relative 0  0  - 1 %   Basophils Absolute 0.0  0.0 - 0.1 K/uL  COMPREHENSIVE METABOLIC PANEL      Component Value Range   Sodium  140  135 - 145 mEq/L   Potassium 3.3 (*) 3.5 - 5.1 mEq/L   Chloride 100  96 - 112 mEq/L   CO2 30  19 - 32 mEq/L   Glucose, Bld 132 (*) 70 - 99 mg/dL   BUN 16  6 - 23 mg/dL   Creatinine, Ser 4.54  0.50 - 1.35 mg/dL   Calcium 9.5  8.4 - 09.8 mg/dL   Total Protein 6.9  6.0 - 8.3 g/dL   Albumin 3.4 (*) 3.5 - 5.2 g/dL   AST 13  0 - 37 U/L   ALT 7  0 - 53 U/L   Alkaline Phosphatase 62  39 - 117 U/L   Total Bilirubin 0.3  0.3 - 1.2 mg/dL   GFR calc non Af Amer 48 (*) >90 mL/min   GFR calc Af Amer 56 (*) >90 mL/min  LIPASE, BLOOD      Component Value Range   Lipase 15  11 - 59 U/L  URINALYSIS, ROUTINE W REFLEX MICROSCOPIC      Component Value Range   Color, Urine YELLOW  YELLOW   APPearance CLOUDY (*) CLEAR   Specific Gravity, Urine 1.029  1.005 - 1.030   pH 5.0  5.0 - 8.0   Glucose, UA NEGATIVE  NEGATIVE mg/dL   Hgb urine dipstick LARGE (*) NEGATIVE   Bilirubin Urine SMALL (*) NEGATIVE   Ketones, ur NEGATIVE  NEGATIVE mg/dL   Protein, ur 30 (*) NEGATIVE mg/dL   Urobilinogen, UA 0.2  0.0 - 1.0 mg/dL   Nitrite NEGATIVE  NEGATIVE   Leukocytes, UA TRACE (*) NEGATIVE  TROPONIN I      Component Value Range   Troponin I <0.30  <0.30 ng/mL  URINE MICROSCOPIC-ADD ON      Component Value Range   WBC, UA 3-6  <3 WBC/hpf   RBC / HPF 21-50  <3 RBC/hpf   Bacteria, UA RARE  RARE   Crystals URIC ACID CRYSTALS (*) NEGATIVE   Urine-Other MUCOUS PRESENT     Laboratory interpretation all normal except hematuria with uric acid crystals, anemia, mild hypokalemia     US Abdomen Complete  11/30/2011  *RADIOLOGY REPORT*  Clinical Data:  Right upper quadrant/epigastric abdominal pain. Prior appendectomy.  COMPLETE ABDOMINAL ULTRASOUND  Comparison:  CT of 11/22/2011.  No prior ultrasound.  Findings:  Gallbladder:  Numerous gallstones.  Mild wall thickening at 5 mm. No pericholecystic  fluid. Sonographic Murphy's sign was not elicited.  Common bile duct: Normal, 5 mm.  Liver: Normal in echogenicity, without focal lesion.  IVC: Negative  Pancreas:  Negative  Spleen:  Normal in size and echogenicity.  Right Kidney:  10.9 cm.  Mild renal cortical thinning. No hydronephrosis.  Left Kidney:  10.9 cm.  Mild renal cortical thinning. No hydronephrosis.  Abdominal aorta:  Mild non aneurysmal dilatation of the infrarenal aorta at maximally 3.0 cm.  IMPRESSION:  1.  Gallstones.  Mild nonspecific gallbladder wall thickening, without pericholecystic fluid or sonographic Murphy's sign.  No biliary ductal dilatation. 2.  Mild renal cortical thinning bilaterally.  Likely within normal variation for patient age.  Original Report Authenticated By: Consuello Bossier, M.D.   Dg Abd Acute W/chest  11/30/2011  *RADIOLOGY REPORT*  Clinical Data: Right upper quadrant pain.  ACUTE ABDOMEN SERIES (ABDOMEN 2 VIEW & CHEST 1 VIEW)  Comparison: CT abdomen and pelvis 11/22/2011.  Two views of the abdomen and 09/13/2011 and plain film of the chest 11/08/2008.  Findings: AICD is in place.  Lung volumes are low with some basilar atelectasis.  Heart size is upper normal.  Two views of the abdomen show gallstones in the right upper quadrant as seen on CT scan.  There is no free intraperitoneal air. Bowel gas pattern is normal.  Convex left scoliosis is noted.  IMPRESSION:  1.  Bibasilar subsegmental atelectasis in a low-volume chest. 2.  Gallstones. 3.  Negative for free intraperitoneal air.  Bowel gas pattern is normal.  Original Report Authenticated By: Bernadene Bell. Maricela Curet, M.D.    Date: 11/30/2011  Rate: 84  Rhythm: Demand pacer paced rhythm  QRS Axis: NA  Intervals:NA  ST/T Wave abnormalities: NA  Conduction Disutrbances:NA  Narrative Interpretation:   Old EKG Reviewed: unchanged from 09/21/2011    1. Right flank pain   2. RUQ abdominal pain   3. Hematuria   4. Gallstones   5. Uric acid crystalluria      Disposition per Dr Corwin Levins, MD, FACEP   MDM          Ward Givens, MD 11/30/11 [phone]

## 2011-11-30 NOTE — ED Notes (Signed)
Onset of right flank pain yesterday now w/ radiation down into right groin. Pt endorses dysuria and is only able to void very small amts at a time, denies feeling like his bladder is distended. C/O nausea w/o emesis. Recent surgery for SBO June 2013.

## 2011-11-30 NOTE — ED Notes (Signed)
Pt in ct scan  

## 2011-11-30 NOTE — ED Notes (Signed)
MD at bedside. 

## 2011-12-02 ENCOUNTER — Encounter: Payer: Self-pay | Admitting: Physician Assistant

## 2011-12-02 ENCOUNTER — Ambulatory Visit (INDEPENDENT_AMBULATORY_CARE_PROVIDER_SITE_OTHER): Payer: Medicare Other | Admitting: Physician Assistant

## 2011-12-02 VITALS — BP 143/60 | HR 79 | Ht 69.0 in | Wt 163.0 lb

## 2011-12-02 DIAGNOSIS — I428 Other cardiomyopathies: Secondary | ICD-10-CM

## 2011-12-02 DIAGNOSIS — I429 Cardiomyopathy, unspecified: Secondary | ICD-10-CM

## 2011-12-02 DIAGNOSIS — R609 Edema, unspecified: Secondary | ICD-10-CM

## 2011-12-02 DIAGNOSIS — I441 Atrioventricular block, second degree: Secondary | ICD-10-CM

## 2011-12-02 MED ORDER — ASPIRIN EC 81 MG PO TBEC: 81.0000 mg | DELAYED_RELEASE_TABLET | Freq: Every day | ORAL | Status: DC

## 2011-12-02 NOTE — Patient Instructions (Signed)
   Aspirin 81mg  daily  Eat one banana daily  Labs - BMET, BNP - due in 7-10 days  Office will contact with results Follow up in  1 month

## 2011-12-02 NOTE — Assessment & Plan Note (Signed)
Followup with Dr. Allred, as scheduled 

## 2011-12-02 NOTE — Assessment & Plan Note (Signed)
Presumed ischemic, based on history of remote abnormal stress test, for which he declined cardiac catheterization. Given his advanced age, and no complaint of exertional CP, I recommend continued conservative management with medical therapy. The patient and his 2 daughters, who are present with him today, concur. Therefore, will initiate therapy with ASA 81 mg daily. Do not recommend adding an ACE inhibitor, given reported stable BP readings at home, as well as his advanced age. He also apparently has occasional cough, which his daughter attributes to postnasal drainage. Regarding diuretic, I recommended conservative measures with elevation of legs, dietary sodium restriction, and decreased total fluid intake. Given his recent labs with mildly depressed potassium level, I will repeat BMET and add a BNP level, in one week. We'll plan on return clinic followup with myself/Dr. Degent, with whom he will establish, in one month.

## 2011-12-02 NOTE — Progress Notes (Signed)
Primary Cardiologist:  Lewayne Bunting, MD (new)    HPI: Patient presents today, per Dr Jenel Lucks request, in followup of recent 2-D echocardiogram, which he ordered for further evaluation of bilateral LE edema , as well as to establish with general cardiology. He has history of presumed CAD following an abnormal stress test in 2005, for which he declined cardiac catheterization. He is status post PPM implantation, by Dr. Johney Frame, 7/10, for symptomatic bradycardia/intermittent CHB.  During recent scheduled followup, Dr. Johney Frame noted mild ST (HR 100 bpm), with normal functioning PPM. He felt this was most likely due to dehydration and recent diarrhea. Complete blood work was ordered, as well as a 2-D echocardiogram for evaluation of bilateral LE edema.    2-D echo: EF 40-45%, with mild LVH and diastolic dysfunction; normal RVF; mild AR/MR  This compares to prior study in 2010, revealing normal LVF (EF 55-65%), with grade 1 diastolic dysfunction, and mild AR  Patient was seen at [facility] 2 days ago and diagnosed with nephrolithiasis, treated conservatively. He had extensive studies and blood work, notable for potassium 3.3, BUN 16, creatinine 1.2, WBC 9.4, hemoglobin 12.8, platelet 153. A recent TSH, per Dr. Johney Frame, was normal.  Clinically, patient presents with no complaint of exertional CP, and denies any prior history of such. He does, however, complained of DOE, but this appears to be chronic and stable. He denies orthopnea or PND, but does have chronic LE edema, unchanged since most recent OV. He also denies palpitations or LOC, but does have history of instability. He continues to [place] alone [place] near [place], where [place], with his daughters living alongside him and checking on him daily.  No Known Allergies  Current Outpatient Prescriptions  Medication Sig Dispense Refill  . acetaminophen (TYLENOL) 500 MG tablet Take 500 mg by mouth every 6 (six) hours as needed. Pain      . ferrous  sulfate 325 (65 FE) MG tablet Take 325 mg by mouth 3 (three) times daily.      Marland Kitchen HYDROcodone-acetaminophen (NORCO/VICODIN) 5-325 MG per tablet 1-2 tablets po q 6 hours prn moderate to severe pain  20 tablet  0  . loperamide (IMODIUM) 2 MG capsule Take 4 mg by mouth as needed. diarrhea      . ondansetron (ZOFRAN) 4 MG tablet Take 4 mg by mouth as needed. Nausea      . travoprost, benzalkonium, (TRAVATAN) 0.004 % ophthalmic solution Place 1 drop into both eyes at bedtime.       Marland Kitchen aspirin EC 81 MG tablet Take 1 tablet (81 mg total) by mouth daily.        Past Medical History  Diagnosis Date  . Atrial tachycardia     nonsustained  . Hiatal hernia   . 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 Glaucoma   . Second degree Mobitz II AV block     s/p PPM   . Macular degeneration 09/12/2011  . Acute confusion due to known medical condition 09/12/2011  . Diarrhea     Past Surgical History  Procedure Date  . Appendectomy   . Pacemaker insertion 11/07/08    St Jude implanted by Dr Johney Frame  . Laparoscopy 09/22/2011    Procedure: LAPAROSCOPY DIAGNOSTIC;  Surgeon: Mariella Saa, MD;  Location: [facility];  Service: General;  Laterality: N/A;  resectiion terminal ileum cecum with anastamosis  . Colon surgery June, 21    SBO    History  Social History  . Marital Status: Widowed    Spouse Name: N/A    Number of Children: 2  . Years of Education: N/A   Occupational History  . Not on file.   Social History Main Topics  . Smoking status: Former Smoker -- 1.0 packs/day for 20 years    Types: Cigarettes    Quit date: 04/15/1946  . Smokeless tobacco: Former Neurosurgeon    Types: Chew    Quit date: 04/15/1948   Comment: chewed tobacco for 2 years  . Alcohol Use: No  . Drug Use: No  . Sexually Active: Not on file   Other Topics Concern  . Not on file   Social History Narrative  . No narrative on file    Family History  Problem Relation Age of Onset  .  Hypertension Mother   . Heart disease Father     ROS: no nausea, vomiting; no fever, chills; no melena, hematochezia; no claudication  PHYSICAL EXAM: BP 143/60  Pulse 79  Ht 5\' 9"  (1.753 m)  Wt 163 lb (73.936 kg)  BMI 24.07 kg/m2  SpO2 97% GENERAL:  78 year old male, sitting upright; NAD HEENT: NCAT, PERRLA, EOMI; sclera clear; no xanthelasma NECK: palpable bilateral carotid pulses, no bruits; no JVD; no TM LUNGS:  right sided crackles; diminished BS on left; no wheezes  CARDIAC: RRR (S1, S2); no significant murmurs; no rubs or gallops ABDOMEN: soft, non-tender; intact BS EXTREMETIES:  2+ bilateral peripheral edema SKIN: warm/dry; no obvious rash/lesions MUSCULOSKELETAL: no joint deformity NEURO: no focal deficit; NL affect   EKG:    ASSESSMENT & PLAN:  Cardiomyopathy Presumed ischemic, based on history of remote abnormal stress test, for which he declined cardiac catheterization. Given his advanced age, and no complaint of exertional CP, I recommend continued conservative management with medical therapy. The patient and his 2 daughters, who are present with him today, concur. Therefore, will initiate therapy with ASA 81 mg daily. Do not recommend adding an ACE inhibitor, given reported stable BP readings at home, as well as his advanced age. He also apparently has occasional cough, which his daughter attributes to postnasal drainage. Regarding diuretic, I recommended conservative measures with elevation of legs, dietary sodium restriction, and decreased total fluid intake. Given his recent labs with mildly depressed potassium level, I will repeat BMET and add a BNP level, in one week. We'll plan on return clinic followup with myself/Dr. Degent, with whom he will establish, in one month.  MOBITZ II ATRIOVENTRICULAR BLOCK Followup with Dr. Johney Frame, as scheduled    Gene Kwame Ryland, PAC

## 2011-12-05 ENCOUNTER — Inpatient Hospital Stay (HOSPITAL_COMMUNITY)
Admission: EM | Admit: 2011-12-05 | Discharge: 2011-12-07 | DRG: 389 | Disposition: A | Discharge status: Home Health | Payer: Medicare Other | Attending: Family Medicine | Admitting: Family Medicine

## 2011-12-05 ENCOUNTER — Encounter (HOSPITAL_COMMUNITY): Payer: Self-pay | Admitting: Emergency Medicine

## 2011-12-05 ENCOUNTER — Emergency Department (HOSPITAL_COMMUNITY): Payer: Medicare Other

## 2011-12-05 DIAGNOSIS — F05 Delirium due to known physiological condition: Secondary | ICD-10-CM

## 2011-12-05 DIAGNOSIS — I441 Atrioventricular block, second degree: Secondary | ICD-10-CM

## 2011-12-05 DIAGNOSIS — R609 Edema, unspecified: Secondary | ICD-10-CM

## 2011-12-05 DIAGNOSIS — R339 Retention of urine, unspecified: Secondary | ICD-10-CM | POA: Diagnosis present

## 2011-12-05 DIAGNOSIS — I4510 Unspecified right bundle-branch block: Secondary | ICD-10-CM

## 2011-12-05 DIAGNOSIS — N179 Acute kidney failure, unspecified: Secondary | ICD-10-CM | POA: Diagnosis present

## 2011-12-05 DIAGNOSIS — H3530 Unspecified macular degeneration: Secondary | ICD-10-CM

## 2011-12-05 DIAGNOSIS — H409 Unspecified glaucoma: Secondary | ICD-10-CM

## 2011-12-05 DIAGNOSIS — Z932 Ileostomy status: Secondary | ICD-10-CM

## 2011-12-05 DIAGNOSIS — R109 Unspecified abdominal pain: Secondary | ICD-10-CM

## 2011-12-05 DIAGNOSIS — R319 Hematuria, unspecified: Secondary | ICD-10-CM

## 2011-12-05 DIAGNOSIS — R5381 Other malaise: Secondary | ICD-10-CM | POA: Diagnosis present

## 2011-12-05 DIAGNOSIS — R000 Tachycardia, unspecified: Secondary | ICD-10-CM

## 2011-12-05 DIAGNOSIS — D649 Anemia, unspecified: Secondary | ICD-10-CM | POA: Diagnosis present

## 2011-12-05 DIAGNOSIS — K5900 Constipation, unspecified: Secondary | ICD-10-CM | POA: Diagnosis present

## 2011-12-05 DIAGNOSIS — I429 Cardiomyopathy, unspecified: Secondary | ICD-10-CM

## 2011-12-05 DIAGNOSIS — K567 Ileus, unspecified: Secondary | ICD-10-CM | POA: Diagnosis present

## 2011-12-05 DIAGNOSIS — E871 Hypo-osmolality and hyponatremia: Secondary | ICD-10-CM | POA: Diagnosis present

## 2011-12-05 DIAGNOSIS — K5641 Fecal impaction: Secondary | ICD-10-CM

## 2011-12-05 DIAGNOSIS — Z87442 Personal history of urinary calculi: Secondary | ICD-10-CM

## 2011-12-05 DIAGNOSIS — K56699 Other intestinal obstruction unspecified as to partial versus complete obstruction: Secondary | ICD-10-CM

## 2011-12-05 DIAGNOSIS — I495 Sick sinus syndrome: Secondary | ICD-10-CM | POA: Diagnosis present

## 2011-12-05 DIAGNOSIS — K56609 Unspecified intestinal obstruction, unspecified as to partial versus complete obstruction: Secondary | ICD-10-CM

## 2011-12-05 DIAGNOSIS — R0602 Shortness of breath: Secondary | ICD-10-CM

## 2011-12-05 DIAGNOSIS — N289 Disorder of kidney and ureter, unspecified: Secondary | ICD-10-CM

## 2011-12-05 DIAGNOSIS — D72829 Elevated white blood cell count, unspecified: Secondary | ICD-10-CM | POA: Diagnosis present

## 2011-12-05 DIAGNOSIS — R112 Nausea with vomiting, unspecified: Secondary | ICD-10-CM | POA: Diagnosis present

## 2011-12-05 DIAGNOSIS — I4891 Unspecified atrial fibrillation: Secondary | ICD-10-CM

## 2011-12-05 DIAGNOSIS — Z950 Presence of cardiac pacemaker: Secondary | ICD-10-CM

## 2011-12-05 DIAGNOSIS — R627 Adult failure to thrive: Secondary | ICD-10-CM | POA: Diagnosis present

## 2011-12-05 DIAGNOSIS — K560 Paralytic ileus: Principal | ICD-10-CM | POA: Diagnosis present

## 2011-12-05 LAB — URINALYSIS, MICROSCOPIC ONLY
Bilirubin Urine: NEGATIVE
Ketones, ur: NEGATIVE mg/dL
Nitrite: NEGATIVE
pH: 6 (ref 5.0–8.0)

## 2011-12-05 LAB — COMPREHENSIVE METABOLIC PANEL
ALT: 7 U/L (ref 0–53)
AST: 13 U/L (ref 0–37)
Alkaline Phosphatase: 68 U/L (ref 39–117)
CO2: 27 mEq/L (ref 19–32)
GFR calc Af Amer: 31 mL/min — ABNORMAL LOW (ref >90)
Glucose, Bld: 133 mg/dL — ABNORMAL HIGH (ref 70–99)
Potassium: 4.7 mEq/L (ref 3.5–5.1)
Sodium: 129 mEq/L — ABNORMAL LOW (ref 135–145)
Total Protein: 7.2 g/dL (ref 6.0–8.3)

## 2011-12-05 LAB — CBC WITH DIFFERENTIAL/PLATELET
Lymphocytes Relative: 6 % — ABNORMAL LOW (ref 12–46)
Lymphs Abs: 0.8 10*3/uL (ref 0.7–4.0)
Neutrophils Relative %: 86 % — ABNORMAL HIGH (ref 43–77)
Platelets: 157 10*3/uL (ref 150–400)
RBC: 4.35 MIL/uL (ref 4.22–5.81)
WBC: 12.6 10*3/uL — ABNORMAL HIGH (ref 4.0–10.5)

## 2011-12-05 LAB — GLUCOSE, CAPILLARY: Glucose-Capillary: 135 mg/dL — ABNORMAL HIGH (ref 70–99)

## 2011-12-05 LAB — OCCULT BLOOD, POC DEVICE: Fecal Occult Bld: NEGATIVE

## 2011-12-05 MED ORDER — IBUPROFEN 800 MG PO TABS: 400.0000 mg | ORAL_TABLET | Freq: Four times a day (QID) | ORAL | Status: DC | PRN

## 2011-12-05 MED ORDER — ONDANSETRON HCL 4 MG/2ML IJ SOLN: 4.0000 mg | Freq: Three times a day (TID) | INTRAMUSCULAR | Status: AC | PRN

## 2011-12-05 MED ORDER — ACETAMINOPHEN 325 MG PO TABS
650.0000 mg | ORAL_TABLET | Freq: Four times a day (QID) | ORAL | Status: DC | PRN
  Administered 2011-12-06: 650 mg via ORAL
  Filled 2011-12-05: qty 2

## 2011-12-05 MED ORDER — SODIUM CHLORIDE 0.9 % IJ SOLN
3.0000 mL | Freq: Two times a day (BID) | INTRAMUSCULAR | Status: DC
  Administered 2011-12-05 – 2011-12-06 (×2): 3 mL via INTRAVENOUS

## 2011-12-05 MED ORDER — SODIUM CHLORIDE 0.9 % IV SOLN: INTRAVENOUS | Status: DC

## 2011-12-05 MED ORDER — ONDANSETRON HCL 4 MG/2ML IJ SOLN
4.0000 mg | Freq: Once | INTRAMUSCULAR | Status: AC
  Administered 2011-12-05: 4 mg via INTRAVENOUS
  Filled 2011-12-05: qty 2

## 2011-12-05 MED ORDER — FENTANYL CITRATE 0.05 MG/ML IJ SOLN
50.0000 ug | Freq: Once | INTRAMUSCULAR | Status: AC
  Administered 2011-12-05: 50 ug via INTRAVENOUS
  Filled 2011-12-05: qty 2

## 2011-12-05 MED ORDER — SORBITOL 70 % SOLN
30.0000 mL | Freq: Every day | Status: DC | PRN
  Filled 2011-12-05: qty 30

## 2011-12-05 MED ORDER — ACETAMINOPHEN 650 MG RE SUPP: 650.0000 mg | Freq: Four times a day (QID) | RECTAL | Status: DC | PRN

## 2011-12-05 MED ORDER — ONDANSETRON HCL 4 MG PO TABS
4.0000 mg | ORAL_TABLET | Freq: Three times a day (TID) | ORAL | Status: DC | PRN
Start: 2011-12-05 — End: 2011-12-07

## 2011-12-05 MED ORDER — MAGNESIUM CITRATE PO SOLN
1.0000 | Freq: Once | ORAL | Status: AC | PRN
  Filled 2011-12-05: qty 296

## 2011-12-05 NOTE — ED Provider Notes (Signed)
Medical screening examination/treatment/procedure(s) were conducted as a shared visit with non-physician practitioner(s) and myself.  I personally evaluated the patient during the encounter Came to ED because could not urinate.  Also lower abd pain.  No n/v/f.  Had bm today.  Daughters say he is less active since he had partial bowel resection approx. 2 mos ago done by dr. Johna Sheriff.  Has had appendectomy as well. On exam no distress.   Mild rlq ttp.  xr shows ileus v sbo.  Mild incr. Wbcs.  Mild renal insufficiency and hyponatremia.  Pain relieved after foley.     Cheri Guppy, MD 12/05/11 [id number]

## 2011-12-05 NOTE — ED Notes (Signed)
4e called x1 for report, RN will call back 

## 2011-12-05 NOTE — ED Provider Notes (Signed)
Medical screening examination/treatment/procedure(s) were conducted as a shared visit with non-physician practitioner(s) and myself.  I personally evaluated the patient during the encounter  Austin Losasso, MD 12/05/11 2330 

## 2011-12-05 NOTE — ED Provider Notes (Signed)
History     CSN: [id number]  Arrival date & time 12/05/11  1229   First MD Initiated Contact with Patient 12/05/11 1352      Chief Complaint  Patient presents with  . Fall  . Urinary Retention  . Rectal Bleeding    (Consider location/radiation/quality/duration/timing/severity/associated sxs/prior treatment) HPI Comments: Austin Pittman is a 78 y.o. Male who presents with complaint of right abdominal pain. Pt was seen here 5 days ago for the same. Was diagnosed with cholelithiasis, right kidney stone. Pt with persistent symptoms since. Now pt unable to urinate for the last 36 hrs, no bowel movement for 3 days other than an episode of black foul smelling diarrhea today. Pt went to see his PCP today, and was sent here for further evaluation. Pt has also had a surgical repair of bowel obstruction with resection of part of small bowel 2 months ago. Was doing well since then until now. Pt denies fever, chills, nausea, vomiting.    Past Medical History  Diagnosis Date  . Atrial tachycardia     nonsustained  . Hiatal hernia   . 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 Glaucoma   . Second degree Mobitz II AV block     s/p PPM   . Macular degeneration 09/12/2011  . Acute confusion due to known medical condition 09/12/2011  . Diarrhea   . Pacemaker     Past Surgical History  Procedure Date  . Appendectomy   . Pacemaker insertion 11/07/08    St Jude implanted by Dr Johney Frame  . Laparoscopy 09/22/2011    Procedure: LAPAROSCOPY DIAGNOSTIC;  Surgeon: Mariella Saa, MD;  Location: [facility];  Service: General;  Laterality: N/A;  resectiion terminal ileum cecum with anastamosis  . Colon surgery June, 21    SBO    Family History  Problem Relation Age of Onset  . Hypertension Mother   . Heart disease Father     History  Substance Use Topics  . Smoking status: Former Smoker -- 1.0 packs/day for 20 years    Types: Cigarettes    Quit date: 04/15/1946  .  Smokeless tobacco: Former Neurosurgeon    Types: Chew    Quit date: 04/15/1948   Comment: chewed tobacco for 2 years  . Alcohol Use: No      Review of Systems  Constitutional: Negative for fever and chills.  HENT: Negative for neck pain and neck stiffness.   Eyes: Negative for visual disturbance.  Respiratory: Negative.   Cardiovascular: Negative.   Gastrointestinal: Positive for abdominal pain, diarrhea and constipation. Negative for nausea and vomiting.  Genitourinary: Positive for flank pain and difficulty urinating. Negative for dysuria, urgency, hematuria, discharge, scrotal swelling, penile pain and testicular pain.  Musculoskeletal: Negative for back pain.  Skin: Negative for color change, pallor and rash.  Neurological: Negative for dizziness, weakness, numbness and headaches.  Psychiatric/Behavioral: Negative.     Allergies  Review of patient's allergies indicates no known allergies.  Home Medications   Current Outpatient Rx  Name Route Sig Dispense Refill  . ACETAMINOPHEN 500 MG PO TABS Oral Take 500 mg by mouth every 6 (six) hours as needed. Pain    . ASPIRIN EC 81 MG PO TBEC Oral Take 1 tablet (81 mg total) by mouth daily.    Marland Kitchen FERROUS SULFATE 325 (65 FE) MG PO TABS Oral Take 325 mg by mouth 3 (three) times daily.    Marland Kitchen HYDROCODONE-ACETAMINOPHEN 5-325 MG PO TABS  1-2 tablets po q 6 hours prn moderate to severe pain 20 tablet 0  . LOPERAMIDE HCL 2 MG PO CAPS Oral Take 4 mg by mouth 4 (four) times daily as needed. diarrhea    . ADULT MULTIVITAMIN W/MINERALS CH Oral Take 1 tablet by mouth daily.    Marland Kitchen ONDANSETRON HCL 4 MG PO TABS Oral Take 4 mg by mouth every 8 (eight) hours as needed. Nausea    . TRAVOPROST 0.004 % OP SOLN Both Eyes Place 1 drop into both eyes at bedtime.       BP 140/83  Pulse 86  Temp 98.6 F (37 C) (Oral)  Resp 24  SpO2 97%  Physical Exam  Nursing note and vitals reviewed. Constitutional: He is oriented to person, place, and time. He appears  well-developed and well-nourished. No distress.  HENT:  Head: Normocephalic.  Eyes: Conjunctivae are normal. Pupils are equal, round, and reactive to light.  Neck: Neck supple.  Cardiovascular: Normal rate and regular rhythm.   Murmur heard. Pulmonary/Chest: Effort normal and breath sounds normal. No respiratory distress. He has no wheezes. He has no rales.  Abdominal: Soft. Bowel sounds are normal. He exhibits no distension. There is no rebound.       Hardened abdomen vs a mass to the left lower quadrant. No suprapubic tenderness. Right lower quadrant right flank tenderness  Musculoskeletal: He exhibits no edema.  Neurological: He is alert and oriented to person, place, and time.  Skin: Skin is warm and dry.  Psychiatric: He has a normal mood and affect.    ED Course  Procedures (including critical care time)  Labs Reviewed  CBC WITH DIFFERENTIAL - Abnormal; Notable for the following:    WBC 12.6 (*)     Hemoglobin 12.7 (*)     HCT 37.6 (*)     Neutrophils Relative 86 (*)     Neutro Abs 10.8 (*)     Lymphocytes Relative 6 (*)     All other components within normal limits  COMPREHENSIVE METABOLIC PANEL - Abnormal; Notable for the following:    Sodium 129 (*)     Chloride 92 (*)     Glucose, Bld 133 (*)     BUN 27 (*)     Creatinine, Ser 2.02 (*)     Albumin 3.4 (*)     GFR calc non Af Amer 27 (*)     GFR calc Af Amer 31 (*)     All other components within normal limits  URINALYSIS, WITH MICROSCOPIC - Abnormal; Notable for the following:    APPearance CLOUDY (*)     Hgb urine dipstick MODERATE (*)     All other components within normal limits  OCCULT BLOOD, POC DEVICE  URINE CULTURE   Dg Abd Acute W/chest  12/05/2011  *RADIOLOGY REPORT*  Clinical Data: Rule out obstruction  ACUTE ABDOMEN SERIES (ABDOMEN 2 VIEW & CHEST 1 VIEW)  Comparison: 11/30/2011  Findings: Cardiomediastinal silhouette is unremarkable.  Dual lead cardiac pacemaker with leads in right atrium and right  ventricle. No acute infiltrate or pulmonary edema.  Stool noted in the right colon.  Significant stool noted within the rectum.  The rectum measures 7.7 cm in diameter.  Distal fecal impaction cannot be excluded.  Mild gaseous distended small bowel loops in mid abdomen with air- fluid levels suspicious for ileus or early bowel obstruction.  No free abdominal air.  Calcified gallstones are noted in the right upper quadrant of the abdomen.  IMPRESSION:  1. No acute disease within chest. 2.Stool noted in the right colon.  Significant stool noted within the rectum.  The rectum measures 7.7 cm in diameter.  Distal fecal impaction cannot be excluded.  Mild gaseous distended small bowel loops in mid abdomen with air- fluid levels suspicious for ileus or early bowel obstruction.   Original Report Authenticated By: Natasha Mead, M.D.    Pt with right lower abdominal pain, right flank pain. Pt had [place] and CT wo contrast 5 days ago, showing cholelithiasis and right UVJ punctate kidney stone. Today, pain continues, creatinine doubled, sodium 129 today. Foley was placed with 700cc of urine output. No blood clots. UA does not appear infected, cultures sent. Acute abd series show possible illeus vs partial small bowel obstruction. Pt not vomiting, has good bowel sounds. Will admit for further evaluation and traetment. Discussed with Dr.Caparossi who agrees with the plan.   4:47 PM Spoke with [facility], asked for surgery consult.   Spoke with general surgery, will see pt in the morning.   Filed Vitals:   12/05/11 1235  BP: 140/83  Pulse: 86  Temp: 98.6 F (37 C)  Resp: 24    1. Renal insufficiency   2. Urinary retention   3. Fecal impaction   4. Ileus   5. Abdominal pain   6. Hematuria       MDM     Pt with multiple issues today. Possible still retained kindey stone with no worsening renal function and urinary retention. Pt also has possible ileus vs SBO on x-ray. Fecal impaction. Hemoccult negative. Will be  further admitted to medicine team for further evaluation and treatment. VS normal currently. He is in no distress.     Lottie Mussel, PA 12/05/11 1652

## 2011-12-05 NOTE — H&P (Signed)
[facility] History and Physical  Austin Pittman ZOX:[id number] DOB: 17-Jun-1915 DOA: 12/05/2011  Referring physician: Nino Parsley PCP: Josue Hector, MD   78 yr old male With recent Ileocecotomy June 9th 2013, and recent visit presents-He was doing fair with post-op-seemed to get weaker more frail and couldn't walk as well.  Came to ED 8/17 for non-obstructive Kidney stone for the surgery, could walk to RR without a cane, now cannot without it or a walker-and is nmore unsteady He called in his daughter last night at about 03:30 am and had fallen as he was up and down to the RR trying to void and -hew as given something for pain-it appeared that he was just hurtuing in his lowe rabdomen and his lower back.  Has had on and off nausea  He has had "diarrhoea" since his ileocecotmy-and all of a sudden from 8/17 started to have constipation which only yielded dark grainy liquid stool yesterday night-he was passing dark tarry stool per daughter which happened x3 since today-once at PCP's office. Patient throughout conversation is very hard of hearing and daughter supplement insulin most of the history and physical. He is in no apparent distress.  Review of Systems:  H/o blurred vision-Macular degen-was taking shots, has been SOB sicne the pacemaker placement, +urinary retention for past 3-4 days, no f/c/n/v/. Has some abd discomfort, no wekaness, has had a h/o falls, + sinus drainage   Past Medical History  Diagnosis Date  . Atrial tachycardia     nonsustained  . Hiatal hernia   . 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 Glaucoma   . Second degree Mobitz II AV block     s/p PPM   . Macular degeneration 09/12/2011  . Acute confusion due to known medical condition 09/12/2011  . Diarrhea   . Pacemaker    Past Surgical History  Procedure Date  . Appendectomy   . Pacemaker insertion 11/07/08    St Jude implanted by Dr Johney Frame  . Laparoscopy 09/22/2011   Procedure: LAPAROSCOPY DIAGNOSTIC;  Surgeon: Mariella Saa, MD;  Location: [facility];  Service: General;  Laterality: N/A;  resectiion terminal ileum cecum with anastamosis  . Colon surgery June, 21    SBO   Social History:  reports that he quit smoking about 65 years ago. His smoking use included Cigarettes. He has a 20 pack-year smoking history. He quit smokeless tobacco use about 63 years ago. His smokeless tobacco use included Chew. He reports that he does not drink alcohol or use illicit drugs.  Lives alone at home-has been doing this since his wife died Can participate in ADLs but is much more frail  No Known Allergies  CHART REVIEW Chart review  History Mobitz 2 secondary AV block plus tachybradycardia syndrome followed by Dr. Bunnie Pion placed 11/07/2008  History of remotely abnormal stress test-patient declined cardiac cath and has been treated medically.  History atrial fibrillation/tachycardia  Small bowel obstruction 09/09/2011-laparoscopic ileal cystectomy 09/22/2011  History of appendectomy as a teenager and paramedian scar consistent with the same  Small right inguinal hernia and cholelithiasis noted on 09/08/2011 admission  Echocardiogram = EF of 4045% mild LVH and diastolic dysfunction mild AR/MR  Treated in the emergency room late July with nephrolithiasis  Family History  Problem Relation Age of Onset  . Hypertension Mother   . Heart disease Father      Prior to Admission medications   Medication Sig Start Date End Date Taking? Authorizing Provider  acetaminophen (TYLENOL) 500 MG tablet Take 500 mg by mouth every 6 (six) hours as needed. Pain   Yes Historical Provider, MD  aspirin EC 81 MG tablet Take 1 tablet (81 mg total) by mouth daily. 12/02/11 12/01/12 Yes Prescott Parma, PA  ferrous sulfate 325 (65 FE) MG tablet Take 325 mg by mouth 3 (three) times daily.   Yes Historical Provider, MD  HYDROcodone-acetaminophen (NORCO/VICODIN) 5-325 MG per tablet 1-2  tablets po q 6 hours prn moderate to severe pain 11/30/11 12/10/11 Yes Gavin Pound. Ghim, MD  loperamide (IMODIUM) 2 MG capsule Take 4 mg by mouth 4 (four) times daily as needed. diarrhea   Yes Historical Provider, MD  Multiple Vitamin (MULTIVITAMIN WITH MINERALS) TABS Take 1 tablet by mouth daily.   Yes Historical Provider, MD  ondansetron (ZOFRAN) 4 MG tablet Take 4 mg by mouth every 8 (eight) hours as needed. Nausea 11/11/11  Yes Historical Provider, MD  travoprost, benzalkonium, (TRAVATAN) 0.004 % ophthalmic solution Place 1 drop into both eyes at bedtime.    Yes Historical Provider, MD   Physical Exam: Filed Vitals:   12/05/11 1235  BP: 140/83  Pulse: 86  Temp: 98.6 F (37 C)  TempSrc: Oral  Resp: 24  SpO2: 97%     General:  Alert pleasant Caucasian male no apparent distress, no pallor no icterus-rhinophyma noted, good dentition, pupils equally reactive to light fundus not visualizable  Neck: Soft nontender JVD elevated to about 5 cm no bruit no thyromegaly  Cardiovascular: S1-S2 regularly irregular rhythm, pace maker noted in left upper chest, telemetry reviewed from the emergency room shows a paced rhythm  Respiratory: Clinically clear no added sound no tactile vocal resonance or fremitus  Abdomen: Of nontender nondistended-rectal exam shows lax anal sphincter with semi-formed stool in the rectal vault, prostate is not felt. Dark brown/black stool is noted on the glove-Hemoccult sent-slight rawness to anal verge at 6 PM position  Skin: Patient has very serious a separate keratoses  Musculoskeletal: Good range of motion to most joints  Psychiatric: Patient is quite throughout interview but seems euthymic and chokes when you're able to make him understand he  Neurologic: Grossly normal motor is 5 out 5 strength in hips knees. Plantars are downgoing. Sensation grossly intact. Smile is symmetric. Uvula is midline. Upper motor strength seems grossly intact  Labs on Admission:    Basic Metabolic Panel:  Lab 12/05/11 4540 11/30/11 1356  NA 129* 140  K 4.7 3.3*  CL 92* 100  CO2 27 30  GLUCOSE 133* 132*  BUN 27* 16  CREATININE 2.02* 1.23  CALCIUM 9.4 9.5  MG -- --  PHOS -- --   Liver Function Tests:  Lab 12/05/11 1405 11/30/11 1356  AST 13 13  ALT 7 7  ALKPHOS 68 62  BILITOT 0.6 0.3  PROT 7.2 6.9  ALBUMIN 3.4* 3.4*    Lab 11/30/11 1356  LIPASE 15  AMYLASE --   No results found for this basename: AMMONIA:5 in the last 168 hours CBC:  Lab 12/05/11 1405 11/30/11 1356  WBC 12.6* 9.4  NEUTROABS 10.8* 7.6  HGB 12.7* 12.8*  HCT 37.6* 38.7*  MCV 86.4 87.4  PLT 157 153   Cardiac Enzymes:  Lab 11/30/11 1356  CKTOTAL --  CKMB --  CKMBINDEX --  TROPONINI <0.30    BNP (last 3 results) No results found for this basename: PROBNP:3 in the last 8760 hours CBG: No results found for this basename: GLUCAP:5 in the last 168 hours  Radiological Exams on Admission: Dg Abd Acute W/chest  12/05/2011  *RADIOLOGY REPORT*  Clinical Data: Rule out obstruction  ACUTE ABDOMEN SERIES (ABDOMEN 2 VIEW & CHEST 1 VIEW)  Comparison: 11/30/2011  Findings: Cardiomediastinal silhouette is unremarkable.  Dual lead cardiac pacemaker with leads in right atrium and right ventricle. No acute infiltrate or pulmonary edema.  Stool noted in the right colon.  Significant stool noted within the rectum.  The rectum measures 7.7 cm in diameter.  Distal fecal impaction cannot be excluded.  Mild gaseous distended small bowel loops in mid abdomen with air- fluid levels suspicious for ileus or early bowel obstruction.  No free abdominal air.  Calcified gallstones are noted in the right upper quadrant of the abdomen.  IMPRESSION:  1. No acute disease within chest. 2.Stool noted in the right colon.  Significant stool noted within the rectum.  The rectum measures 7.7 cm in diameter.  Distal fecal impaction cannot be excluded.  Mild gaseous distended small bowel loops in mid abdomen with air-  fluid levels suspicious for ileus or early bowel obstruction.   Original Report Authenticated By: Natasha Mead, M.D.     EKG: Independently reviewed. Rhythm with atrial sensed ventricular pacing right bundle branch block notable no other determination can be made based on this EKG. There does appear to be left anterior fascicular block  Assessment/Plan Principal Problem:  *Ileus Active Problems:  Urinary retention  Constipation  Acute renal insufficiency  Hyponatremia  Leukocytosis   1. Nausea vomiting constipation-patient was seen 11/30/2011 4 urinary incontinence and was noted to have distal punctate ureteral stone on CT scan. Patient was prescribed Norco and Colace however developed further urinary retention. I believe that his constipation/diarrhea is multifactorial and potentially related to his ileocecostomy for his diarrhea and then his constipation came on because he was given Norco and had already been on nimodipine in the past. Patient gets his medications from his elder daughter, however he potentially could surreptitiously be taking this. I will discontinue all opiates and all bowel dysmotility agents and review him in the morning. 2. Potential GI bleed versus impaction?-Fecal occult was negative-abdominal x-ray calls this potential ileus versus early SBO-Gen. surgery has been consulted by emergency room. I have repeated the same and his stool is dark and tarry appearing. He may potentially just have Ileus with impacted stool. Place him on sorbitol 70% twice a day and if this does not work, we will start him on Moviprep versus GI colonic prep in the morning to see if this does help his bowels move. He has had one dark diarrheal stool and we will check his hemoglobin in the morning and reassess. Patient is hemodynamically stable and I will get orthostatics however we will need to repeat his CBC in the morning to ensure that this is not a GI bleed-get orthostatics every shift for 24 hours--it  is noted that patient takes ferrous sulfate 325 mg 3 times a day which could be another potential reason for constipation and dark stool 3. Recent mild right-sided hydroureteronephrosis secondary to punctate distal right ureteric calculus on CT scan 8/17-there is no significant blockage now as after pacing a Foley catheter patient voided almost 1 L in the emergency room. If needed we will involve urology and is cared for at this stage we will not. 4. Tachybradycardia syndrome/pacemaker placed 7 2010-this is currently stable he will need a telemetry monitored floor 5. Hyponatremia + acute kidney injury-careful IV fluid repletion at 100 cc an hour. Unfortunately patient already  started IV fluid prior to having his fractional excretion of sodium calculated and we will curtail his fluids tomorrow once he is seen again. 6. Leukocytosis-unclear etiology the patient without fever. Patient might spike fever and in that case we will order blood and urine cultures and repeat chest x-ray. Hold any antibiotics for now  Code Status: Full Family Communication: Discussed with daughter at bedside entire plan of care and expectations for care Disposition Plan: Telemetry, inpatient, team #2  Time spent: 1 hour  Mahala Menghini [facility] Pager [phone]  If 7PM-7AM, please contact night-coverage [email or web address] Password [facility] 12/05/2011, 6:06 PM

## 2011-12-05 NOTE — ED Notes (Signed)
Fell into bathtub last night while standing at commode- c/o low back pain, knot on back of head- denies LOC, also c/o not urinating - has not urinated today- abd pain started. Seen here Saturday for urinary retention. Also daughter states that stool this am was coffee ground looking, with foul odor

## 2011-12-05 NOTE — ED Notes (Signed)
MD at bedside. 

## 2011-12-05 NOTE — Progress Notes (Signed)
Austin Pittman, is a 78 y.o. male,   MRN: [id number]  -  DOB - November 21, 1915  Outpatient Primary MD for the patient is Josue Hector, MD  in for    Chief Complaint  Patient presents with  . Fall  . Urinary Retention  . Rectal Bleeding     Blood pressure 140/83, pulse 86, temperature 98.6 F (37 C), temperature source Oral, resp. rate 24, SpO2 97.00%.  Principal Problem:  *Ileus Active Problems:  Urinary retention  Constipation  Acute renal insufficiency  Hyponatremia  Leukocytosis very pleasant 78 yo hx SBO s/p resection 2 months ago, presents to ed cc abdominal pain. Pt seen 5 days ago for same, diagnosed with cholelithiasis and discharged. Symptoms persisted and pt unable to void since yesterday. Family also reports intermittent persistent nausea since surgery for which he takes zofran. Family reports pt taking very little pain medicine. Has not had bm in 3 days.   Work up in ED yields acute abdomen with no acute disease within chest. Stool noted in the right colon. Significant stool noted within the rectum. The rectum measures 7.7 cm in diameter. Distal fecal impaction cannot be excluded. Mild gaseous distended small bowel loops in mid abdomen with air- fluid levels suspicious for ileus or early bowel obstruction. WC 12.6, sodium 129, creatinine 2.02 up from 1.2 on 11/30/11. Foley inserted and 800cc amber drained. Urinalysis neg for infection.   On exam pt alert, oriented to self and place. Reports feeling better since foley. Abdomen slightly firm and slightly tender to palpation. Non toxic appearing.   Will admit to med/surg. Requested that ED MD call surgery for consult given close proximity of surgery and possible SBO.

## 2011-12-05 NOTE — ED Notes (Signed)
Pt seen by pcp before ED, pcp told pt he has afib. EKG done

## 2011-12-06 ENCOUNTER — Inpatient Hospital Stay (HOSPITAL_COMMUNITY): Payer: Medicare Other

## 2011-12-06 LAB — COMPREHENSIVE METABOLIC PANEL
Albumin: 2.9 g/dL — ABNORMAL LOW (ref 3.5–5.2)
BUN: 26 mg/dL — ABNORMAL HIGH (ref 6–23)
Calcium: 9 mg/dL (ref 8.4–10.5)
Creatinine, Ser: 2.07 mg/dL — ABNORMAL HIGH (ref 0.50–1.35)
Potassium: 4.7 mEq/L (ref 3.5–5.1)
Total Protein: 6.3 g/dL (ref 6.0–8.3)

## 2011-12-06 LAB — URINE CULTURE
Colony Count: NO GROWTH
Culture: NO GROWTH

## 2011-12-06 LAB — CBC
HCT: 34.5 % — ABNORMAL LOW (ref 39.0–52.0)
Hemoglobin: 11.3 g/dL — ABNORMAL LOW (ref 13.0–17.0)
MCV: 87.3 fL (ref 78.0–100.0)
RBC: 3.95 MIL/uL — ABNORMAL LOW (ref 4.22–5.81)
WBC: 9.7 10*3/uL (ref 4.0–10.5)

## 2011-12-06 MED ORDER — TRAVOPROST 0.004 % OP SOLN
1.0000 [drp] | Freq: Every day | OPHTHALMIC | Status: DC
  Administered 2011-12-06: 1 [drp] via OPHTHALMIC
  Filled 2011-12-06 (×10): qty 0.1

## 2011-12-06 MED ORDER — SODIUM CHLORIDE 0.9 % IV SOLN: INTRAVENOUS | Status: DC

## 2011-12-06 MED ORDER — TAMSULOSIN HCL 0.4 MG PO CAPS
0.4000 mg | ORAL_CAPSULE | Freq: Once | ORAL | Status: AC
  Administered 2011-12-06: 0.4 mg via ORAL
  Filled 2011-12-06: qty 1

## 2011-12-06 NOTE — Progress Notes (Signed)
PROGRESS NOTE  Austin Pittman ZOX:[id number] DOB: 09-24-1915 DOA: 12/05/2011 PCP: Josue Hector, MD  Brief narrative: 78 yr old male admit with Urinary retention, Possible SBO and Adult Failure to thrive  Past medical history-As per Problem list CHART REVIEW  Chart review  History Mobitz 2 secondary AV block plus tachybradycardia syndrome followed by Dr. Bunnie Pion placed 11/07/2008  History of remotely abnormal stress test-patient declined cardiac cath and has been treated medically.  History atrial fibrillation/tachycardia  Small bowel obstruction 09/09/2011-laparoscopic ileal cystectomy 09/22/2011  History of appendectomy as a teenager and paramedian scar consistent with the same  Small right inguinal hernia and cholelithiasis noted on 09/08/2011 admission  Echocardiogram = EF of 4045% mild LVH and diastolic dysfunction mild AR/MR  Treated in the emergency room late July with nephrolithiasis  Consultants:  8/23 Surgery  Procedures:  AXR 8/22=1. No acute disease within chest. 2.Stool noted in the right colon. Significant stool noted withinthe rectum. The rectum measures 7.7 cm in diameter. Distal fecal impaction cannot be excluded. Mild gaseous distended small bowel loops in mid abdomen with air- fluid levels suspicious for ileus or early bowel obstruction.  AXR 8/23=IMPRESSION: Nonspecific bowel gas pattern. Slightly prominent left abdominal small bowel loops may reflect mild focal ileus, but early low grade small bowel obstruction cannot be completely excluded.  Antibiotics:  UC pending   Subjective  Doing well.  Feels much better.  Ambulaed c nursing in room.  Had a full breakfast.  No nn/v/sob Passed 2 large balls of stool with no overt bleeding. States he feels better and wishes to go home   Objective    Interim History: Nutritionist has seen him and not made any recommendations Appreciate  general surgery input  Telemetry: Sinus rhythm, no red  alarms  Objective: Filed Vitals:   12/06/11 0615 12/06/11 0620 12/06/11 0625 12/06/11 1438  BP: 112/65 127/75 115/71 114/61  Pulse: 73 73 96 66  Temp: 98.1 F (36.7 C)   98.1 F (36.7 C)  TempSrc: Oral   Oral  Resp: 20   18  Height:      Weight: 74.8 kg (164 lb 14.5 oz)     SpO2: 99%   97%    Intake/Output Summary (Last 24 hours) at 12/06/11 1528 Last data filed at 12/06/11 1114  Gross per 24 hour  Intake    123 ml  Output   4051 ml  Net  -3928 ml    Exam:  General: Alert pleasant Caucasian male no apparent distress, no pallor no icterus-rhinophyma noted, good dentition, pupils equally reactive to light fundus not visualizable  Neck: Soft nontender JVD elevated to about 5 cm no bruit no thyromegaly  Cardiovascular: S1-S2 regularly irregular rhythm, pace maker noted in left upper chest, telemetry reviewed from the emergency room shows a paced rhythm  Respiratory: Clinically clear no added sound no tactile vocal resonance or fremitus  Abdomen: Of nontender nondistended-   Data Reviewed: Basic Metabolic Panel:  Lab 12/06/11 8119 12/05/11 1405 11/30/11 1356  NA 135 129* 140  K 4.7 4.7 --  CL 98 92* 100  CO2 29 27 30   GLUCOSE 100* 133* 132*  BUN 26* 27* 16  CREATININE 2.07* 2.02* 1.23  CALCIUM 9.0 9.4 9.5  MG -- -- --  PHOS -- -- --   Liver Function Tests:  Lab 12/06/11 0400 12/05/11 1405 11/30/11 1356  AST 12 13 13   ALT 7 7 7   ALKPHOS 63 68 62  BILITOT 0.5 0.6 0.3  PROT 6.3  7.2 6.9  ALBUMIN 2.9* 3.4* 3.4*    Lab 11/30/11 1356  LIPASE 15  AMYLASE --   No results found for this basename: AMMONIA:5 in the last 168 hours CBC:  Lab 12/06/11 0400 12/05/11 1405 11/30/11 1356  WBC 9.7 12.6* 9.4  NEUTROABS -- 10.8* 7.6  HGB 11.3* 12.7* 12.8*  HCT 34.5* 37.6* 38.7*  MCV 87.3 86.4 87.4  PLT 137* 157 153   Cardiac Enzymes:  Lab 11/30/11 1356  CKTOTAL --  CKMB --  CKMBINDEX --  TROPONINI <0.30   BNP: No components found with this basename:  POCBNP:5 CBG:  Lab 12/05/11 2007  GLUCAP 135*    No results found for this or any previous visit (from the past 240 hour(s)).   Studies:              All Imaging reviewed and is as per above notation   Scheduled Meds:   . sodium chloride  3 mL Intravenous Q12H  . travoprost (benzalkonium)  1 drop Both Eyes QHS  . DISCONTD: sodium chloride   Intravenous STAT   Continuous Infusions:   . sodium chloride       Assessment/Plan  Principal Problem:  *Ileus  Active Problems:  Urinary retention  Constipation  Acute renal insufficiency  Hyponatremia  Leukocytosis   1. Nausea vomiting constipation-patient was seen 11/30/2011 for urinary incontinence and was noted to have distal punctate ureteral stone on CT scan. Patient was prescribed Norco and Colace however developed further urinary retention. I believe that his constipation/diarrhea is multifactorial and potentially related to his ileocecostomy for his diarrhea and then his constipation came on because he was given Norco and Imodium.  2. Acute kidney injury-base line creatinine is 1.23.  His creatinine is still trending up-is normal lites creatinine per calculation is 22.  We will continue IV fluids at decreased rate of 50 cc per hour given some elevation of JVD and reassess in the morning. 3. Urinary retention-likely secondary to dysmotility agents, opiates.  These have been discontinued.  Foley to be clamped.  If patient experienced the sensation to void catheter to be removed.  Nursing made aware. 4. Potential GI bleed versus impaction?-Fecal occult was negative-abdominal x-ray calls this potential ileus versus early SBO- I have repeated the same and his stool is dark and tarry appearing. He may potentially just have Ileus with impacted stool.  Patient was given sorbitol 8/22 with good results.  General surgery was consulted and they made recommendations and have signed off.  Appreciate input 5. Recent mild right-sided  hydroureteronephrosis secondary to punctate distal right ureteric calculus on CT scan 8/17-there is no significant blockage now as after pacing a Foley catheter patient voided almost 1 L in the emergency room. If needed we will involve urology-see above 6. Tachybradycardia syndrome/pacemaker placed 7 2010-this is currently stable he will need a telemetry monitored floor-portable a.m. chest x-ray  7. Hyponatremia + acute kidney injury-careful IV fluid repletion at 100 cc an hour-this was cut back to 50 cc per hour 12/06/2011.  8. Leukocytosis-unclear etiology the patient without fever. Patient might spike fever and in that case we will order blood and urine cultures and repeat chest x-ray. Hold any antibiotics for now 9. Anemia-probably dilutional-patient has dropped 1 point.  We will cut back to IV fluids and repeat labs in the morning.  If needed we will get iron studies however in this case I do not think they are necessary.  Patient is asymptomatic 10. Adult failure to thrive  secondary to #1 debility secondary to surgery and #2 relative immobility-PTOT consult.  Nursing made aware that patient should be ambulated in the halls.  Patient potentially will need skilled nursing placement once formal therapy evaluation is done  Code Status: Full Family Communication: Long discussion with both daughters in room this afternoon Disposition Plan: Inpatient   Pleas Koch, MD  [facility] Pager [phone] 12/06/2011, 3:28 PM    LOS: 1 day

## 2011-12-06 NOTE — Progress Notes (Signed)
Patient ID: Austin Pittman, male   DOB: 1915-05-18, 78 y.o.   MRN: [id number]    Subjective: This is a patient who had an ileocecectomy on September 22, 2011 by Dr. [name].  He has done ok since then except for having some diarrhea.  He has been treating that, but also taking Fe.  He apparently came to the ED on Saturday with pain in his right flank radiating to his groin.  They came to the ED to rule a surgical complication.  He was found to have a kidney stone.  He was sent home.  He then developed and inability to urinate and abdominal pain again.  He came back to [facility] yesterday.  A foley was placed and this relieved his pain.  Because he c/o abdominal pain though an x-ray was obtained which revealed a stool impaction as well as a few minimally dilated loops of bowel c/w possible ileus vs early PSBO.  We were asked to see the patient.  In the meantime, the patient denies nausea and has had several large BMs.  Objective: Vital signs in last 24 hours: Temp:  [98.1 F (36.7 C)-98.8 F (37.1 C)] 98.1 F (36.7 C) (08/23 0615) Pulse Rate:  [73-96] 96  (08/23 0625) Resp:  [20-24] 20  (08/23 0615) BP: (112-140)/(65-83) 115/71 mmHg (08/23 0625) SpO2:  [97 %-99 %] 99 % (08/23 0615) Weight:  [164 lb 14.5 oz (74.8 kg)-170 lb 13.7 oz (77.5 kg)] 164 lb 14.5 oz (74.8 kg) (08/23 0615) Last BM Date: 12/06/11  Intake/Output from previous day: 08/22 0701 - 08/23 0700 In: 3 [I.V.:3] Out: 3375 [Urine:3375] Intake/Output this shift:    PE: Abd: soft, NT, ND, +BS Ht: regular Lungs: CTAB  Lab Results:   Basename 12/06/11 0400 12/05/11 1405  WBC 9.7 12.6*  HGB 11.3* 12.7*  HCT 34.5* 37.6*  PLT 137* 157   BMET  Basename 12/06/11 0400 12/05/11 1405  NA 135 129*  K 4.7 4.7  CL 98 92*  CO2 29 27  GLUCOSE 100* 133*  BUN 26* 27*  CREATININE 2.07* 2.02*  CALCIUM 9.0 9.4   PT/INR  Basename 12/06/11 0400  LABPROT 13.8  INR 1.04   CMP     Component Value Date/Time   NA 135 12/06/2011 0400   K 4.7  12/06/2011 0400   CL 98 12/06/2011 0400   CO2 29 12/06/2011 0400   GLUCOSE 100* 12/06/2011 0400   BUN 26* 12/06/2011 0400   CREATININE 2.07* 12/06/2011 0400   CALCIUM 9.0 12/06/2011 0400   PROT 6.3 12/06/2011 0400   ALBUMIN 2.9* 12/06/2011 0400   AST 12 12/06/2011 0400   ALT 7 12/06/2011 0400   ALKPHOS 63 12/06/2011 0400   BILITOT 0.5 12/06/2011 0400   GFRNONAA 26* 12/06/2011 0400   GFRAA 30* 12/06/2011 0400   Lipase     Component Value Date/Time   LIPASE 15 11/30/2011 1356       Studies/Results: Acute Abdominal Series  12/06/2011  *RADIOLOGY REPORT*  Clinical Data: Diarrhea.  Question small bowel obstruction.  Recent colon surgery.  ACUTE ABDOMEN SERIES (ABDOMEN 2 VIEW & CHEST 1 VIEW)  Comparison: 12/05/2011.  Findings: Improving aeration in the lungs with increasing lung volumes.  Minimal residual bibasilar atelectasis or scarring. Heart is normal size.  Left pacer remains in place, unchanged.  Nonspecific bowel gas pattern.  There are a few mildly prominent left abdominal small bowel loops.  Gas and moderate stool throughout the colon.  No free air.  Calcified gallstones  noted. No other suspicious calcifications.  Degenerative changes in the spine and hips.  IMPRESSION: Nonspecific bowel gas pattern.  Slightly prominent left abdominal small bowel loops may reflect mild focal ileus, but early low grade small bowel obstruction cannot be completely excluded.   Original Report Authenticated By: Cyndie Chime, M.D.    Dg Abd Acute W/chest  12/05/2011  *RADIOLOGY REPORT*  Clinical Data: Rule out obstruction  ACUTE ABDOMEN SERIES (ABDOMEN 2 VIEW & CHEST 1 VIEW)  Comparison: 11/30/2011  Findings: Cardiomediastinal silhouette is unremarkable.  Dual lead cardiac pacemaker with leads in right atrium and right ventricle. No acute infiltrate or pulmonary edema.  Stool noted in the right colon.  Significant stool noted within the rectum.  The rectum measures 7.7 cm in diameter.  Distal fecal impaction cannot be  excluded.  Mild gaseous distended small bowel loops in mid abdomen with air- fluid levels suspicious for ileus or early bowel obstruction.  No free abdominal air.  Calcified gallstones are noted in the right upper quadrant of the abdomen.  IMPRESSION:  1. No acute disease within chest. 2.Stool noted in the right colon.  Significant stool noted within the rectum.  The rectum measures 7.7 cm in diameter.  Distal fecal impaction cannot be excluded.  Mild gaseous distended small bowel loops in mid abdomen with air- fluid levels suspicious for ileus or early bowel obstruction.   Original Report Authenticated By: Natasha Mead, M.D.     Anti-infectives: Anti-infectives    None       Assessment/Plan  1. S/p ileocecectomy 2. Nephrolithiasis 3. Urinary retention 4. Fecal impaction  Plan: 1. Some diarrhea after an ileocecectomy is expected as the ileocecal valve is removed and this can give people some diarrhea.  I would treat this symptomatically, but not put the patient on something chronically as this can cause constipation. 2. He does not have any obstructive symptoms and his x-rays appear benign.  He is on a regular diet.  Agree with this.  No surgical intervention needed.  Please call if further assistance needed.   LOS: 1 day    Austin Pittman E 12/06/2011

## 2011-12-06 NOTE — Progress Notes (Signed)
General Surgery [facility] Patient much improved.  Urinary retention relieved with catheter.  BM's since admission.  Abdomen soft and non-tender on exam at this time.  Will sign off.  Call if needed. Velora Heckler, MD, [facility] Office: [phone]

## 2011-12-06 NOTE — Progress Notes (Signed)
Pt ambulated to nurses' station, back to room with walker.  Tolerated well, shoulders remained forward, but when reminded, pt stood straight.  Foley catheter clamped at 15:30 per MD orders.  Pt denies feeling the need to urinate.  Will continue to monitor and assess.  Clovia Cuff

## 2011-12-06 NOTE — Care Management Note (Unsigned)
    Page 1 of 1   12/06/2011     3:38:58 PM   CARE MANAGEMENT NOTE 12/06/2011  Patient:  Austin Pittman, Austin Pittman   Account Number:  [id number]  Date Initiated:  12/06/2011  Documentation initiated by:  Lanier Clam  Subjective/Objective Assessment:   ADMITTED W/ABD PAIN.ACUTE RENAL INSUFFICIENCY     Action/Plan:   FROM HOME ALONE.HAS DAUGHTER SUPPORT.HAS CANE, RW.   Anticipated DC Date:  12/10/2011   Anticipated DC Plan:  HOME W HOME HEALTH SERVICES         Choice offered to / List presented to:  C-1 Patient           Status of service:  In process, will continue to follow Medicare Important Message given?   (If response is "NO", the following Medicare IM given date fields will be blank) Date Medicare IM given:   Date Additional Medicare IM given:    Discharge Disposition:    Per UR Regulation:  Reviewed for med. necessity/level of care/duration of stay  If discussed at Long Length of Stay Meetings, dates discussed:    Comments:  12/06/11 Rhodes Calvert RN,BSN NCM [phone] PROVIDED W/HHC AGENCY LIST AS RESOURCE.

## 2011-12-06 NOTE — Progress Notes (Signed)
Nutrition Brief Note  Patient identified on the Malnutrition Screening Tool (MST) report for weight loss, generating a score of 3.   Pt reports very good appetite and intake currently and prior to admit.  UBW=162#  Per pt.   Body mass index is 24.35 kg/(m^2). Weight is WNL based on current BMI.   Current diet order is Regular. patient is consuming approximately 75-100% of meals at this time. Labs and medications reviewed.   No nutrition interventions warranted at this time. If nutrition issues arise, please consult RD.   Oran Rein, RD, LDN Clinical Inpatient Dietitian Pager:  [phone] Weekend and after hours pager:  [phone]

## 2011-12-07 DIAGNOSIS — R109 Unspecified abdominal pain: Secondary | ICD-10-CM

## 2011-12-07 DIAGNOSIS — K56609 Unspecified intestinal obstruction, unspecified as to partial versus complete obstruction: Secondary | ICD-10-CM

## 2011-12-07 LAB — COMPREHENSIVE METABOLIC PANEL
ALT: 7 U/L (ref 0–53)
AST: 12 U/L (ref 0–37)
Albumin: 2.9 g/dL — ABNORMAL LOW (ref 3.5–5.2)
CO2: 26 mEq/L (ref 19–32)
Chloride: 102 mEq/L (ref 96–112)
GFR calc non Af Amer: 44 mL/min — ABNORMAL LOW (ref >90)
Sodium: 138 mEq/L (ref 135–145)
Total Bilirubin: 0.3 mg/dL (ref 0.3–1.2)

## 2011-12-07 LAB — CBC WITH DIFFERENTIAL/PLATELET
Basophils Relative: 1 % (ref 0–1)
Eosinophils Absolute: 0.4 10*3/uL (ref 0.0–0.7)
Eosinophils Relative: 6 % — ABNORMAL HIGH (ref 0–5)
Hemoglobin: 11.2 g/dL — ABNORMAL LOW (ref 13.0–17.0)
MCH: 28.3 pg (ref 26.0–34.0)
MCHC: 32.3 g/dL (ref 30.0–36.0)
MCV: 87.6 fL (ref 78.0–100.0)
Monocytes Relative: 9 % (ref 3–12)
Neutrophils Relative %: 68 % (ref 43–77)
Platelets: 146 10*3/uL — ABNORMAL LOW (ref 150–400)

## 2011-12-07 LAB — GLUCOSE, CAPILLARY: Glucose-Capillary: 104 mg/dL — ABNORMAL HIGH (ref 70–99)

## 2011-12-07 MED ORDER — TAMSULOSIN HCL 0.4 MG PO CAPS: 0.4000 mg | ORAL_CAPSULE | Freq: Every day | ORAL | Status: DC

## 2011-12-07 NOTE — Discharge Summary (Addendum)
Physician Discharge Summary  Austin Pittman ZOX:[id number] DOB: July 24, 1915 DOA: 12/05/2011  PCP: Austin Hector, MD  Admit date: 12/05/2011 Discharge date: 12/07/2011  Recommendations for Outpatient Follow-up:  1. Recommend CBC and be met probably in 3-5 days 2. Outpatient urology followup if he develops any further urinary retention-started on Flomax in the hospital  3. Consider Goals of Care if he declines please-Family doesn't wish aggressive measures, but this wasn't overtly discussed in-patient  Discharge Diagnoses:  Principal Problem:  *Acute renal insufficiency Active Problems:  Urinary retention  Constipation  Hyponatremia  Leukocytosis  Ileus   Discharge Condition: Good-Needs however Goals of Care as an outpatient  Diet recommendation: regular  Filed Weights   12/06/11 0500 12/06/11 0615 12/07/11 0529  Weight: 75 kg (165 lb 5.5 oz) 74.8 kg (164 lb 14.5 oz) 73.3 kg (161 lb 9.6 oz)    History of present illness:  78 yr old male with recent Ileocecotomy June 9th 2013 [with ensuant chronic diarrhoea], and recent ED visit 11/30/11 present to [facility] emergency room 12/05/2011-seemed to get weaker more frail and couldn't walk as well. Came to ED 8/17 for non-obstructive Kidney stone for the surgery [he as given Opiates for pain at ED visit]-it appeared that he was just hurtuing in his lower abdomen and his lower back.   His prior level of functioning before the surgery was that he could could walk to RR without a cane, now cannot without it or a walker-and was more unsteady  He called in his daughter the night prior to admission at about 03:30 am and had fallen as he was up and down to the RR trying to void. He has had on and off nausea  He has had "diarrhoea" since his ileocecotmy-and all of a sudden from 8/17 started to have constipation which only yielded dark grainy liquid stool yesterday night-he was passing dark tarry stool per daughter which happened x3 since day of  admit-once at PCP's office.  Patient throughout conversation is very hard of hearing and daughter Austin Pittman of whom is a retired dialysis nurse] supplements most of the history and physical. He is in no apparent distress.   Hospital Course:  Assessment/Plan  Principal Problem:  *Ileus  Active Problems:  Urinary retention  Constipation  Acute renal insufficiency  Hyponatremia  Leukocytosis    1. Nausea vomiting constipation-patient was seen 11/30/2011 for urinary incontinence and was noted to have distal punctate ureteral stone on CT scan. Patient was prescribed Norco and Colace however developed further urinary retention. I believe that his constipation/diarrhea is multifactorial and potentially related to his ileocecostomy for his diarrhea and then his constipation came on because he was given Norco and Imodium-over hospitalization course patient was noted to actually do much better subsequent to his volume depletion being corrected.  He became more conversant and stronger. 2. Acute kidney injury-base line creatinine is 1.23.  On admission this was 2.07.  Family stated that he would never have wanted dialysis.  With volume repletion with IV saline patient did very well and although he came back to baseline BUN/creatinine ratio was 27/1.33.  I have recommended to his family that he needs to have a basic metabolic panel performed in about 3-5 stays at primary care physician's office.  I have also recommended that patient take 2-2-1/2 L daily especially if is outdoors for the next 2-3 days 3. Urinary retention-likely secondary to dysmotility agents, opiates. These have been discontinued. Foley to be clamped.  Given patient's advanced age it is  potential that he has some prostatic hypertrophy although on exam initially in the emergency room I did not feel his prostate.  He will need to continue the Flomax 0.4 mg and have outpatient followup with his primary care physician regarding risks and benefits of PSA  screening. 4. Potential GI bleed versus impaction?-Fecal occult was negative x 2-abdominal x-ray called this potential ileus versus early SBO-  Patient was given sorbitol 8/22 with good results. General surgery was consulted and they made recommendations and have signed off. 5. Recent mild right-sided hydroureteronephrosis secondary to punctate distal right ureteric calculus on CT scan 8/17-there is no significant blockage now as after pacing a Foley catheter patient voided almost 1 L in the emergency room. 6. Tachybradycardia syndrome/pacemaker placed 7 2010-this is currently stable he will need a telemetry monitored floor. 7. Hyponatremia + acute kidney injury-careful IV fluid repletion at 100 cc an hour-this was cut back to 50 cc per hour 12/06/2011 and discontinued on day of discharge 8. Leukocytosis-unclear etiology the patient without fever-potentially secondary to hemoconcentration initially.  Hold any antibiotics for now-he was not started on antibiotics 9. Anemia-probably dilutional-patient has dropped 1 point over hospital stay.  Recheck as an outpatient 10. Adult failure to thrive secondary to #1 debility secondary to surgery and #2 relative immobility-PTOT consult. Nursing made aware that patient should be ambulated in the halls. Patient potentially will need skilled nursing placement once formal therapy evaluation is done-physical therapy recommended home health placement with therapy and this was reinforced and the presence of patient's 3 daughters.  CHART REVIEW  History Mobitz 2 secondary AV block plus tachybradycardia syndrome followed by Dr. Bunnie Pittman placed 11/07/2008  History of remotely abnormal stress test-patient declined cardiac cath and has been treated medically.  History atrial fibrillation/tachycardia  Small bowel obstruction 09/09/2011-laparoscopic ileal cystectomy 09/22/2011  History of appendectomy as a teenager and paramedian scar consistent with the same  Small  right inguinal hernia and cholelithiasis noted on 09/08/2011 admission  Echocardiogram = EF of 4045% mild LVH and diastolic dysfunction mild AR/MR  Treated in the emergency room late July with nephrolithiasis  Consultants:  8/23 Surgery  Procedures:  AXR 8/22=1. No acute disease within chest. 2.Stool noted in the right colon. Significant stool noted withinthe rectum. The rectum measures 7.7 cm in diameter. Distal fecal impaction cannot be excluded. Mild gaseous distended small bowel loops in mid abdomen with air- fluid levels suspicious for ileus or early bowel obstruction. AXR 8/23=IMPRESSION: Nonspecific bowel gas pattern. Slightly prominent left abdominal small bowel loops may reflect mild focal ileus, but early low grade small bowel obstruction cannot be completely excluded.   Antibiotics:  UC 8/22 was neg   Interim History:  Nutritionist has seen him and not made any recommendations  Appreciate general surgery input   Telemetry:  Sinus rhythm [paced], some PVC's  Discharge Exam: Filed Vitals:   12/07/11 0529  BP: 124/75  Pulse: 52  Temp: 98.4 F (36.9 C)  Resp: 18   Filed Vitals:   12/06/11 0625 12/06/11 1438 12/06/11 2039 12/07/11 0529  BP: 115/71 114/61 107/61 124/75  Pulse: 96 66 68 52  Temp:  98.1 F (36.7 C) 98.7 F (37.1 C) 98.4 F (36.9 C)  TempSrc:  Oral Oral Oral  Resp:  18 20 18   Height:      Weight:    73.3 kg (161 lb 9.6 oz)  SpO2:  97% 95% 94%   Exam:  General: Alert pleasant Caucasian male no apparent distress, no pallor no icterus-rhinophyma  noted, good dentition, pupils equally reactive to light fundus not visualizable  Neck: Soft nontender JVD elevated to about 5 cm no bruit no thyromegaly  Cardiovascular: S1-S2 regularly irregular rhythm, pace maker noted in left upper chest, telemetry reviewed from the emergency room shows a paced rhythm  Respiratory: Clinically clear no added sound no tactile vocal resonance or fremitus    Discharge  Instructions  Discharge Orders    Future Appointments: Provider: Department: Dept Phone: Center:   12/30/2011 2:30 PM Rachael Fee, MD [facility] [place] [facility] [phone] [facility]   01/09/2012 9:00 AM Mariella Saa, MD [facility] [place] [phone] None   01/10/2012 1:20 PM Prescott Parma, PA [facility] [place] [phone] [facility]   01/22/2012 10:30 AM Hillis Range, MD [facility] [place] [facility] [phone] [facility]     Future Orders Please Complete By Expires   Diet - low sodium heart healthy      Increase activity slowly      Call MD for:  temperature >100.4      Call MD for:  severe uncontrolled pain      Call MD for:  redness, tenderness, or signs of infection (pain, swelling, redness, odor or green/yellow discharge around incision site)      Call MD for:  difficulty breathing, headache or visual disturbances      (HEART FAILURE PATIENTS) Call MD:  Anytime you have any of the following symptoms: 1) 3 pound weight gain in 24 hours or 5 pounds in 1 week 2) shortness of breath, with or without a dry hacking cough 3) swelling in the hands, feet or stomach 4) if you have to sleep on extra pillows at night in order to breathe.      Call MD for:  extreme fatigue      Call MD for:  persistant dizziness or light-headedness        Medication List  As of 12/07/2011  1:47 PM   STOP taking these medications         HYDROcodone-acetaminophen 5-325 MG per tablet      loperamide 2 MG capsule         TAKE these medications         acetaminophen 500 MG tablet   Commonly known as: TYLENOL   Take 500 mg by mouth every 6 (six) hours as needed. Pain      aspirin EC 81 MG tablet   Take 1 tablet (81 mg total) by mouth daily.      ferrous sulfate 325 (65 FE) MG tablet   Take 325 mg by mouth 3 (three) times daily.      multivitamin with minerals Tabs   Take 1 tablet by mouth daily.      ondansetron 4 MG tablet   Commonly known as: ZOFRAN   Take 4 mg by mouth every 8 (eight) hours  as needed. Nausea      Tamsulosin HCl 0.4 MG Caps   Commonly known as: FLOMAX   Take 1 capsule (0.4 mg total) by mouth daily after supper.      travoprost (benzalkonium) 0.004 % ophthalmic solution   Commonly known as: TRAVATAN   Place 1 drop into both eyes at bedtime.              The results of significant diagnostics from this hospitalization (including imaging, microbiology, ancillary and laboratory) are listed below for reference.    Significant Diagnostic Studies: Ct Abdomen Pelvis Wo Contrast  11/30/2011  *RADIOLOGY REPORT*  Clinical Data:  Right-sided flank and  right lower quadrant pain with history of kidney stones.  History bowel resection  secondary obstruction.  CT ABDOMEN AND PELVIS WITHOUT CONTRAST (CT UROGRAM)  Technique: Contiguous axial images of the abdomen and pelvis without oral or intravenous contrast were obtained.  Comparison: Ultrasound of the earlier today.  Abdominal pelvic CT of 11/22/2011.  Findings:  Exam is limited for evaluation of entities other than urinary tract calculi due to lack of oral or intravenous contrast.   Mild fibrosis at the lung bases.  Cardiomegaly with an incompletely imaged pacer. No pericardial or pleural effusion. Scattered low density liver lesions which are likely small cysts. Old granulomas disease in the spleen.  Normal stomach.  Pancreatic atrophy.  Gallstones without specific evidence of acute cholecystitis and no evidence of biliary ductal dilatation.  Normal adrenal glands.  Expected renal atrophy for age. Mild right- sided hydroureteronephrosis.  This continues to the level of a punctate distal right ureteric calculus on coronal image 58.  Infrarenal abdominal aortic dilatation at 3.0 cm, without surrounding hemorrhage. No retroperitoneal or retrocrural adenopathy.  Extensive colonic diverticulosis.  Sigmoid colonic wall thickening is likely related to muscular hypertrophy.  Surgical sutures of partial right hemicolectomy.  Edema in  the region of the anastomoses is slightly decreased.  There is fascial thickening which is also slightly improved. No pneumatosis or free intraperitoneal air.  No drainable fluid collection.  No evidence of small bowel obstruction.  A fat containing right inguinal hernia. No pelvic adenopathy. Normal urinary bladder.  Mild prostatomegaly.  Trace cul-de-sac fluid on image 73.  Degenerative partial fusion of the bilateral sacroiliac joints.  IMPRESSION:  1.  Development of mild right-sided hydroureteronephrosis, secondary to a punctate distal right ureteric calculus. 2.  Improvement in inflammatory change surrounding the ileocolic anastomoses, status post partial right hemicolectomy. 3.  Cholelithiasis without specific evidence of cholecystitis. 4.  Borderline aneurysmal dilatation of the infrarenal aorta.  Original Report Authenticated By: Consuello Bossier, M.D.   US Abdomen Complete  11/30/2011  *RADIOLOGY REPORT*  Clinical Data:  Right upper quadrant/epigastric abdominal pain. Prior appendectomy.  COMPLETE ABDOMINAL ULTRASOUND  Comparison:  CT of 11/22/2011.  No prior ultrasound.  Findings:  Gallbladder:  Numerous gallstones.  Mild wall thickening at 5 mm. No pericholecystic fluid. Sonographic Murphy's sign was not elicited.  Common bile duct: Normal, 5 mm.  Liver: Normal in echogenicity, without focal lesion.  IVC: Negative  Pancreas:  Negative  Spleen:  Normal in size and echogenicity.  Right Kidney:  10.9 cm.  Mild renal cortical thinning. No hydronephrosis.  Left Kidney:  10.9 cm.  Mild renal cortical thinning. No hydronephrosis.  Abdominal aorta:  Mild non aneurysmal dilatation of the infrarenal aorta at maximally 3.0 cm.  IMPRESSION:  1.  Gallstones.  Mild nonspecific gallbladder wall thickening, without pericholecystic fluid or sonographic Murphy's sign.  No biliary ductal dilatation. 2.  Mild renal cortical thinning bilaterally.  Likely within normal variation for patient age.  Original Report  Authenticated By: Consuello Bossier, M.D.   Ct Abdomen Pelvis W Contrast  11/22/2011  *RADIOLOGY REPORT*  Clinical Data: Evaluate for bowel obstruction.  CT ABDOMEN AND PELVIS WITH CONTRAST  Technique:  Multidetector CT imaging of the abdomen and pelvis was performed following the standard protocol during bolus administration of intravenous contrast.  Contrast: OMNIPAQUE IOHEXOL 300 MG/ML  SOLN  Comparison: 09/21/2011  Findings: Interstitial reticulation is identified within both lung bases.  Suspect mild honeycombing.  No pericardial or pleural effusion.  There is mild diffuse  fatty infiltration of the liver.  Cyst is identified within the caudate lobe of the liver.  This measures approximately 9 mm.  There are several large stones identified within the lumen of the gallbladder.  These measure up to 1.7 cm.  No significant gallbladder wall edema or pericholecystic fluid. No biliary dilatation.  The pancreas is normal.  Calcified granuloma noted within the spleen.  Both adrenal glands are normal.  There is bilateral renal atrophy. Cyst is noted within the inferior pole of the right kidney measuring 1.6 cm, image 42.  The urinary bladder appears normal. Prostate gland and seminal vesicles are unremarkable.  No enlarged upper abdominal lymph nodes.  There is no pelvic or inguinal adenopathy.  Bilateral fat containing inguinal hernias are present.  The stomach appears normal.  The small bowel loops have a normal caliber.  No evidence for small bowel obstruction.  Postoperative change there is mild wall thickening and inflammatory change involving the proximal colon.  Small soft tissue attenuating structure within the right lower quadrant measures 1.5 cm and may represent an area of phlegmon formation or an enlarged lymph node. No evidence for colonic obstruction.  There is marked sigmoid diverticulosis without active inflammation.  IMPRESSION:  1.  Examination is positive for inflammatory change within the right  lower quadrant involving the enterocolonic anastomoses and the ascending colon. 2.  Soft tissue attenuating structure adjacent to the enterocolonic anastomoses may represent phlegmon or lymph node. 3.  No drainable abscess or evidence of bowel obstruction. 4.  Gallstones  5.  Fatty infiltration of the liver. 6.  Chronic interstitial changes within the lung bases.  Query early pulmonary fibrosis.  Original Report Authenticated By: Rosealee Albee, M.D.   Acute Abdominal Series  12/06/2011  *RADIOLOGY REPORT*  Clinical Data: Diarrhea.  Question small bowel obstruction.  Recent colon surgery.  ACUTE ABDOMEN SERIES (ABDOMEN 2 VIEW & CHEST 1 VIEW)  Comparison: 12/05/2011.  Findings: Improving aeration in the lungs with increasing lung volumes.  Minimal residual bibasilar atelectasis or scarring. Heart is normal size.  Left pacer remains in place, unchanged.  Nonspecific bowel gas pattern.  There are a few mildly prominent left abdominal small bowel loops.  Gas and moderate stool throughout the colon.  No free air.  Calcified gallstones noted. No other suspicious calcifications.  Degenerative changes in the spine and hips.  IMPRESSION: Nonspecific bowel gas pattern.  Slightly prominent left abdominal small bowel loops may reflect mild focal ileus, but early low grade small bowel obstruction cannot be completely excluded.   Original Report Authenticated By: Cyndie Chime, M.D.    Dg Abd Acute W/chest  12/05/2011  *RADIOLOGY REPORT*  Clinical Data: Rule out obstruction  ACUTE ABDOMEN SERIES (ABDOMEN 2 VIEW & CHEST 1 VIEW)  Comparison: 11/30/2011  Findings: Cardiomediastinal silhouette is unremarkable.  Dual lead cardiac pacemaker with leads in right atrium and right ventricle. No acute infiltrate or pulmonary edema.  Stool noted in the right colon.  Significant stool noted within the rectum.  The rectum measures 7.7 cm in diameter.  Distal fecal impaction cannot be excluded.  Mild gaseous distended small bowel loops  in mid abdomen with air- fluid levels suspicious for ileus or early bowel obstruction.  No free abdominal air.  Calcified gallstones are noted in the right upper quadrant of the abdomen.  IMPRESSION:  1. No acute disease within chest. 2.Stool noted in the right colon.  Significant stool noted within the rectum.  The rectum measures 7.7 cm in diameter.  Distal fecal impaction cannot be excluded.  Mild gaseous distended small bowel loops in mid abdomen with air- fluid levels suspicious for ileus or early bowel obstruction.   Original Report Authenticated By: Natasha Mead, M.D.    Dg Abd Acute W/chest  11/30/2011  *RADIOLOGY REPORT*  Clinical Data: Right upper quadrant pain.  ACUTE ABDOMEN SERIES (ABDOMEN 2 VIEW & CHEST 1 VIEW)  Comparison: CT abdomen and pelvis 11/22/2011.  Two views of the abdomen and 09/13/2011 and plain film of the chest 11/08/2008.  Findings: AICD is in place.  Lung volumes are low with some basilar atelectasis.  Heart size is upper normal.  Two views of the abdomen show gallstones in the right upper quadrant as seen on CT scan.  There is no free intraperitoneal air. Bowel gas pattern is normal.  Convex left scoliosis is noted.  IMPRESSION:  1.  Bibasilar subsegmental atelectasis in a low-volume chest. 2.  Gallstones. 3.  Negative for free intraperitoneal air.  Bowel gas pattern is normal.  Original Report Authenticated By: Bernadene Bell. Maricela Curet, M.D.    Microbiology: Recent Results (from the past 240 hour(s))  URINE CULTURE     Status: Normal   Collection Time   12/05/11  2:39 PM      Component Value Range Status Comment   Specimen Description URINE, CATHETERIZED   Final    Special Requests NONE   Final    Culture  Setup Time 12/06/2011 01:26   Final    Colony Count NO GROWTH   Final    Culture NO GROWTH   Final    Report Status 12/06/2011 FINAL   Final   CLOSTRIDIUM DIFFICILE BY PCR     Status: Normal   Collection Time   12/06/11 10:16 PM      Component Value Range Status Comment     C difficile by pcr NEGATIVE  NEGATIVE Final      Labs: Basic Metabolic Panel:  Lab 12/07/11 1610 12/06/11 0400 12/05/11 1405 11/30/11 1356  NA 138 135 129* 140  K 3.9 4.7 4.7 3.3*  CL 102 98 92* 100  CO2 26 29 27 30   GLUCOSE 98 100* 133* 132*  BUN 27* 26* 27* 16  CREATININE 1.33 2.07* 2.02* 1.23  CALCIUM 8.6 9.0 9.4 9.5  MG -- -- -- --  PHOS -- -- -- --   Liver Function Tests:  Lab 12/07/11 0517 12/06/11 0400 12/05/11 1405 11/30/11 1356  AST 12 12 13 13   ALT 7 7 7 7   ALKPHOS 58 63 68 62  BILITOT 0.3 0.5 0.6 0.3  PROT 6.2 6.3 7.2 6.9  ALBUMIN 2.9* 2.9* 3.4* 3.4*    Lab 11/30/11 1356  LIPASE 15  AMYLASE --   No results found for this basename: AMMONIA:5 in the last 168 hours CBC:  Lab 12/07/11 0517 12/06/11 0400 12/05/11 1405 11/30/11 1356  WBC 6.5 9.7 12.6* 9.4  NEUTROABS 4.4 -- 10.8* 7.6  HGB 11.2* 11.3* 12.7* 12.8*  HCT 34.7* 34.5* 37.6* 38.7*  MCV 87.6 87.3 86.4 87.4  PLT 146* 137* 157 153   Cardiac Enzymes:  Lab 11/30/11 1356  CKTOTAL --  CKMB --  CKMBINDEX --  TROPONINI <0.30   BNP: BNP (last 3 results) No results found for this basename: PROBNP:3 in the last 8760 hours CBG:  Lab 12/07/11 0741 12/05/11 2007  GLUCAP 104* 135*    Time coordinating discharge: 35 minutes  Signed:  Rhetta Mura  [facility] 12/07/2011, 1:27 PM

## 2011-12-07 NOTE — Evaluation (Signed)
Physical Therapy Evaluation Patient Details Name: Austin Pittman MRN: [id number] DOB: 31-Jul-1915 Today's Date: 12/07/2011 Time: 1136-1200 PT Time Calculation (min): 24 min  PT Assessment / Plan / Recommendation Clinical Impression  Pt. was admitted 8/22 w/ urinary retention, had fall at home. Pt. plans tom DC to home. Family present to confirm. Pt. will benefit from PT to return home w/ 24/7    PT Assessment  Patient needs continued PT services    Follow Up Recommendations  No PT follow up    Barriers to Discharge        Equipment Recommendations  None recommended by PT    Recommendations for Other Services     Frequency Min 3X/week    Precautions / Restrictions Precautions Precautions: Fall   Pertinent Vitals/Pain No c/o      Mobility  Transfers Transfers: Sit to Stand;Stand to Sit Sit to Stand: 5: Supervision;From chair/3-in-1;With upper extremity assist Stand to Sit: 5: Supervision;To chair/3-in-1;Without upper extremity assist Ambulation/Gait Ambulation/Gait Assistance: 4: Min assist;4: Min guard Ambulation Distance (Feet): 200 Feet Assistive device: Rolling walker Ambulation/Gait Assistance Details: pt managed RW  well. Gait Pattern: Step-through pattern Gait velocity: WFL    Exercises     PT Diagnosis: Difficulty walking  PT Problem List: Decreased activity tolerance;Decreased balance;Decreased mobility PT Treatment Interventions: DME instruction;Gait training;Functional mobility training;Therapeutic activities;Patient/family education   PT Goals Acute Rehab PT Goals PT Goal Formulation: With patient/family Potential to Achieve Goals: Good Pt will go Supine/Side to Sit: Independently PT Goal: Supine/Side to Sit - Progress: Goal set today Pt will go Sit to Supine/Side: Independently PT Goal: Sit to Supine/Side - Progress: Goal set today Pt will go Stand to Sit: with supervision PT Goal: Stand to Sit - Progress: Goal set today Pt will Ambulate: >150  feet;with supervision;with least restrictive assistive device PT Goal: Ambulate - Progress: Goal set today  Visit Information  Last PT Received On: 12/07/11 Assistance Needed: +1    Subjective Data  Subjective: I want to go home Patient Stated Goal: same   Prior Functioning  Home Living Lives With: Alone Available Help at Discharge: Family;Available 24 hours/day Type of Home: House Home Access: Stairs to enter [employer] of Steps: 3 Entrance Stairs-Rails: None Home Layout: One level Bathroom Shower/Tub: Engineer, manufacturing systems: Standard Home Adaptive Equipment: Walker - rolling;Straight cane Prior Function Level of Independence: Independent with assistive device(s);Needs assistance Needs Assistance: Meal Prep;Light Housekeeping Able to Take Stairs?: Yes Vocation: Retired Comments: family checks on pt several times /day. 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, Brewing technologist, lawnmower PTA Communication Communication: No difficulties    Cognition  Overall Cognitive Status: Appears within functional limits for tasks assessed/performed Arousal/Alertness: Awake/alert Orientation Level: Appears intact for tasks assessed Behavior During Session: [facility] for tasks performed    Extremity/Trunk Assessment     Balance Dynamic Standing Balance Dynamic Standing - Comments: able to pick up object from floor  End of Session PT - End of Session Activity Tolerance: Patient tolerated treatment well Patient left: in chair;with call bell/phone within reach;with family/visitor present  GP     Rada Hay 12/07/2011, 2:49 PM  [phone]

## 2011-12-07 NOTE — Progress Notes (Signed)
Cm spoke with patient concerning dc planning. MD order for [facility] services. Per pt choice with daughter Sheliah Mends to provide [facility] services upon discharge. [facility] notified of referral. Demographics, H/P, MD orders faxed to [facility] at [phone]. Per intake start of care 8/26-8/28.   Leonie Green [phone]

## 2011-12-07 NOTE — Evaluation (Deleted)
Physical Therapy Evaluation Patient Details Name: Austin Pittman MRN: [id number] DOB: 07-12-1915 Today's Date: 12/07/2011 Time: 1136-1200 PT Time Calculation (min): 24 min  PT Assessment / Plan / Recommendation Clinical Impression  Pt. was admitted 8/22 w/ urinary retention, had fall at home. Pt. plans tom DC to home. Family present to confirm. Pt. will benefit from PT to return home w/ 24/7    PT Assessment  Patient needs continued PT services    Follow Up Recommendations  No PT follow up    Barriers to Discharge        Equipment Recommendations  None recommended by PT    Recommendations for Other Services     Frequency Min 3X/week    Precautions / Restrictions Precautions Precautions: Fall   Pertinent Vitals/Pain No c/o      Mobility  Transfers Transfers: Sit to Stand;Stand to Sit Sit to Stand: 5: Supervision;From chair/3-in-1;With upper extremity assist Stand to Sit: 5: Supervision;To chair/3-in-1;Without upper extremity assist Ambulation/Gait Ambulation/Gait Assistance: 4: Min assist;4: Min guard Ambulation Distance (Feet): 200 Feet Assistive device: Rolling walker Ambulation/Gait Assistance Details: pt managed RW  well. Gait Pattern: Step-through pattern Gait velocity: WFL    Exercises     PT Diagnosis: Difficulty walking  PT Problem List: Decreased activity tolerance;Decreased balance;Decreased mobility PT Treatment Interventions: DME instruction;Gait training;Functional mobility training;Therapeutic activities;Patient/family education   PT Goals Acute Rehab PT Goals PT Goal Formulation: With patient/family Potential to Achieve Goals: Good Pt will go Supine/Side to Sit: Independently PT Goal: Supine/Side to Sit - Progress: Goal set today Pt will go Sit to Supine/Side: Independently PT Goal: Sit to Supine/Side - Progress: Goal set today Pt will go Stand to Sit: with supervision PT Goal: Stand to Sit - Progress: Goal set today Pt will Ambulate: >150  feet;with supervision;with least restrictive assistive device PT Goal: Ambulate - Progress: Goal set today  Visit Information  Last PT Received On: 12/07/11 Assistance Needed: +1    Subjective Data  Subjective: I want to go home Patient Stated Goal: same   Prior Functioning  Home Living Lives With: Alone Available Help at Discharge: Family;Available 24 hours/day Type of Home: House Home Access: Stairs to enter [employer] of Steps: 3 Entrance Stairs-Rails: None Home Layout: One level Bathroom Shower/Tub: Engineer, manufacturing systems: Standard Home Adaptive Equipment: Walker - rolling;Straight cane Prior Function Level of Independence: Independent with assistive device(s);Needs assistance Needs Assistance: Meal Prep;Light Housekeeping Able to Take Stairs?: Yes Vocation: Retired Comments: family checks on pt several times /day. 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, Brewing technologist, lawnmower PTA Communication Communication: No difficulties    Cognition  Overall Cognitive Status: Appears within functional limits for tasks assessed/performed Arousal/Alertness: Awake/alert Orientation Level: Appears intact for tasks assessed Behavior During Session: [facility] for tasks performed    Extremity/Trunk Assessment     Balance Dynamic Standing Balance Dynamic Standing - Comments: able to pick up object from floor  End of Session PT - End of Session Activity Tolerance: Patient tolerated treatment well Patient left: in chair;with call bell/phone within reach;with family/visitor present  GP     Rada Hay 12/07/2011, 2:50 PM  [phone]

## 2011-12-07 NOTE — Progress Notes (Signed)
Pts foley was clamped yesterday evening to determine whether or not he felt the urge to void. He was able to feel the urge so I d/c'd the foley. Pt has been able to use urinal all night. Bladder scanned patient after voiding and only got 80-100 cc's in bladder. Pt has had no complaints of feelings of retention. Will continue to monitor.

## 2011-12-30 ENCOUNTER — Ambulatory Visit (INDEPENDENT_AMBULATORY_CARE_PROVIDER_SITE_OTHER): Payer: Medicare Other | Admitting: Gastroenterology

## 2011-12-30 ENCOUNTER — Encounter: Payer: Self-pay | Admitting: Gastroenterology

## 2011-12-30 VITALS — BP 140/60 | HR 80 | Ht 67.5 in | Wt 165.0 lb

## 2011-12-30 DIAGNOSIS — R197 Diarrhea, unspecified: Secondary | ICD-10-CM

## 2011-12-30 DIAGNOSIS — R933 Abnormal findings on diagnostic imaging of other parts of digestive tract: Secondary | ICD-10-CM

## 2011-12-30 MED ORDER — COLESEVELAM HCL 625 MG PO TABS: 1250.0000 mg | ORAL_TABLET | Freq: Every day | ORAL | Status: DC

## 2011-12-30 NOTE — Progress Notes (Signed)
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  DAughters feel like his symptoms making things better.  Has been on welchol powder samples daily.  Since then his symptoms have been improving.  NO overt gi  Bleeding.  No abd pains.    Had fecal impaction 2-3 weeks ago, improved with   He underwent ileocecectomy June 2013 4 stricture of terminal ileum likely related to 4 cm tubulovillous adenoma that was found on pathology.  There was also an ulceration in the terminal ileum, no signs on pathology that it was related to Crohn's disease. It was postulated that perhaps NSAIDs cause the ulcer.   Review of systems: Pertinent positive and negative review of systems were noted in the above HPI section. Complete review of systems was performed and was otherwise normal.    Past Medical History  Diagnosis Date  . Atrial tachycardia     nonsustained  . Hiatal hernia   . 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 Glaucoma   . Second degree Mobitz II AV block     s/p PPM   . Macular degeneration 09/12/2011  . Acute confusion due to known medical condition 09/12/2011  . Diarrhea   . Pacemaker     Past Surgical History  Procedure Date  . Appendectomy   . Pacemaker insertion 11/07/08    St Jude implanted by Dr Johney Frame  . Laparoscopy 09/22/2011    Procedure: LAPAROSCOPY DIAGNOSTIC;  Surgeon: Mariella Saa, MD;  Location: [facility];  Service: General;  Laterality: N/A;  resectiion terminal ileum cecum with anastamosis  . Colon surgery June, 21    SBO    Current Outpatient Prescriptions  Medication Sig Dispense Refill  . acetaminophen (TYLENOL) 500 MG tablet Take 500 mg by mouth every 6 (six) hours as needed. Pain      . aspirin EC 81 MG tablet Take 1 tablet (81 mg total) by mouth daily.      . Colesevelam HCl (WELCHOL PO) Take by mouth as directed.        . ferrous sulfate 325 (65 FE) MG tablet Take 325 mg by mouth 3 (three) times daily.      . Multiple Vitamin (MULTIVITAMIN WITH MINERALS) TABS Take 1 tablet by mouth daily.      . ondansetron (ZOFRAN) 4 MG tablet Take 4 mg by mouth every 8 (eight) hours as needed. Nausea      . Tamsulosin HCl (FLOMAX) 0.4 MG CAPS Take 1 capsule (0.4 mg total) by mouth daily after supper.  30 capsule  0  . travoprost, benzalkonium, (TRAVATAN) 0.004 % ophthalmic solution Place 1 drop into both eyes at bedtime.         Allergies as of 12/30/2011  . (No Known Allergies)    Family History  Problem Relation Age of Onset  . Hypertension Mother   . Heart disease Father   . Colon cancer Daughter     History   Social History  . Marital Status: Widowed    Spouse Name: N/A    Number of Children: 2  . Years of Education: N/A   Occupational History  . Not on file.   Social History Main Topics  . Smoking status: Former Smoker -- 1.0 packs/day for 20 years    Types: Cigarettes    Quit date: 04/15/1946  . Smokeless  tobacco: Former Neurosurgeon    Types: Chew    Quit date: 04/15/1948   Comment: chewed tobacco for 2 years  . Alcohol Use: No  . Drug Use: No  . Sexually Active: No   Other Topics Concern  . Not on file   Social History Narrative   Lives alone at homeWife passed in 1993-Had a CADCathy-[id number]       Physical Exam: BP 140/60  Pulse 80  Ht 5' 7.5" (1.715 m)  Wt 165 lb (74.844 kg)  BMI 25.46 kg/m2 Constitutional: Somewhat frail Psychiatric: alert and oriented x3 Eyes: extraocular movements intact Mouth: oral pharynx moist, no lesions Neck: supple no lymphadenopathy Cardiovascular: heart regular rate and rhythm Lungs: clear to auscultation bilaterally Abdomen: soft, nontender, nondistended, no obvious ascites, no peritoneal signs, normal bowel sounds Extremities: no lower extremity edema bilaterally Skin: no lesions on visible extremities    Assessment and plan: 78 y.o. male with   improved diarrhea  Unclear cause of his bowel obstruction, stricture June 2013. Pathology did not support the idea of Crohn's disease. He was found to have a 4 cm polyp in his cecum. Perhaps this was intermittently obstructing, causing thickening of the in terminal ileum. He was very bothered by diarrhea for several weeks, months after the surgery. He was admitted with stool impaction recently, this was cleaned out laxatives and he was told not to take Imodium again. He has felt much better since then. WelChol was also started by his primary care physician about a week ago and this helped his intermittent loose stools.  I did explain to know what large polyp in his colon and affected his never had a colonoscopy for certainly one is indicated however at his age of 53 I'm not sure that is the best idea clinically. He was very clear that he does not want a colonoscopy in would never want colonoscopy.  His bowels have really even out of the past several weeks and I'm most inclined to do not much at all for him since he is improving on recently started WelChol. I have given him a prescription for it, pill form as the powder form was so expensive. He is going to return to see me in 4-5 weeks and sooner if needed.

## 2011-12-30 NOTE — Patient Instructions (Addendum)
Continue on the welchol that was recently (stop the powder form, start the pill form (called in today). Back off iron to one pill once daily. Return to see Dr. Christella Hartigan in 4-5 weeks.

## 2012-01-09 ENCOUNTER — Encounter (INDEPENDENT_AMBULATORY_CARE_PROVIDER_SITE_OTHER): Payer: Medicare Other | Admitting: General Surgery

## 2012-01-10 ENCOUNTER — Ambulatory Visit: Payer: Medicare Other | Admitting: Physician Assistant

## 2012-01-15 ENCOUNTER — Encounter: Payer: Self-pay | Admitting: *Deleted

## 2012-01-15 DIAGNOSIS — Z950 Presence of cardiac pacemaker: Secondary | ICD-10-CM | POA: Insufficient documentation

## 2012-01-20 ENCOUNTER — Telehealth: Payer: Self-pay | Admitting: Cardiology

## 2012-01-20 NOTE — Telephone Encounter (Signed)
Call patient daughter back to reschedule appointment that he has 01-22-2012.

## 2012-01-22 ENCOUNTER — Encounter: Payer: Medicare Other | Admitting: Internal Medicine

## 2012-01-22 NOTE — Telephone Encounter (Signed)
Called Mr. Game and rescheduled his appointment for Nov. 2013 with Dr. Johney Frame.

## 2012-02-07 ENCOUNTER — Ambulatory Visit: Payer: Medicare Other | Admitting: Gastroenterology

## 2012-02-17 ENCOUNTER — Encounter: Payer: Self-pay | Admitting: Internal Medicine

## 2012-02-17 ENCOUNTER — Ambulatory Visit (INDEPENDENT_AMBULATORY_CARE_PROVIDER_SITE_OTHER): Payer: Medicare Other | Admitting: Internal Medicine

## 2012-02-17 VITALS — BP 122/72 | HR 65 | Ht 67.5 in | Wt 174.4 lb

## 2012-02-17 DIAGNOSIS — I442 Atrioventricular block, complete: Secondary | ICD-10-CM

## 2012-02-17 DIAGNOSIS — I4891 Unspecified atrial fibrillation: Secondary | ICD-10-CM

## 2012-02-17 DIAGNOSIS — I429 Cardiomyopathy, unspecified: Secondary | ICD-10-CM

## 2012-02-17 DIAGNOSIS — I428 Other cardiomyopathies: Secondary | ICD-10-CM

## 2012-02-17 LAB — PACEMAKER DEVICE OBSERVATION
AL AMPLITUDE: 2.8 mv
AL IMPEDENCE PM: 337.5 Ohm
ATRIAL PACING PM: 2.4
RV LEAD IMPEDENCE PM: 600 Ohm
RV LEAD THRESHOLD: 0.5 V

## 2012-02-17 NOTE — Patient Instructions (Signed)
Continue all current medications. Dr. Johney Frame - 1 year Routine cardiology follow up in 6 months

## 2012-02-17 NOTE — Progress Notes (Signed)
PCP: Josue Hector, MD  Austin Pittman is a 78 y.o. male who presents today for routine electrophysiology followup.  Since last being seen in our clinic, the patient reports doing very well.  He has returned to his baseline.  Today, he denies symptoms of palpitations, chest pain, shortness of breath,  lower extremity edema, dizziness, presyncope, or syncope.  The patient is otherwise without complaint today.   Past Medical History  Diagnosis Date  . Atrial tachycardia     nonsustained  . Hiatal hernia   . 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 Glaucoma(365)   . Second degree Mobitz II AV block     s/p PPM   . Macular degeneration 09/12/2011  . Acute confusion due to known medical condition 09/12/2011  . Diarrhea   . Pacemaker    Past Surgical History  Procedure Date  . Appendectomy   . Pacemaker insertion 11/07/08    St Jude implanted by Dr Johney Frame  . Laparoscopy 09/22/2011    Procedure: LAPAROSCOPY DIAGNOSTIC;  Surgeon: Mariella Saa, MD;  Location: [facility];  Service: General;  Laterality: N/A;  resectiion terminal ileum cecum with anastamosis  . Colon surgery June, 21    SBO    Current Outpatient Prescriptions  Medication Sig Dispense Refill  . acetaminophen (TYLENOL) 500 MG tablet Take 500 mg by mouth every 6 (six) hours as needed. Pain       . aspirin EC 81 MG tablet Take 1 tablet (81 mg total) by mouth daily.      . colesevelam (WELCHOL) 625 MG tablet Take 2 tablets (1,250 mg total) by mouth daily.  60 tablet  11  . ferrous sulfate 325 (65 FE) MG tablet Take 325 mg by mouth daily with breakfast.       . Multiple Vitamin (MULTIVITAMIN WITH MINERALS) TABS Take 1 tablet by mouth daily.      . ondansetron (ZOFRAN) 4 MG tablet Take 4 mg by mouth every 8 (eight) hours as needed. Nausea      . Tamsulosin HCl (FLOMAX) 0.4 MG CAPS Take 1 capsule (0.4 mg total) by mouth daily after supper.  30 capsule  0  . travoprost, benzalkonium, (TRAVATAN)  0.004 % ophthalmic solution Place 1 drop into both eyes at bedtime.         Physical Exam: Filed Vitals:   02/17/12 1247  BP: 122/72  Pulse: 65  Height: 5' 7.5" (1.715 m)  Weight: 174 lb 6.4 oz (79.107 kg)    GEN- The patient is well appearing, alert and oriented x 3 today.   Head- normocephalic, atraumatic Eyes-  Sclera clear, conjunctiva pink Ears- hearing intact Oropharynx- clear Lungs- Clear to ausculation bilaterally, normal work of breathing Chest- pacemaker pocket is well healed Heart- Regular rate and rhythm, no murmurs, rubs or gallops, PMI not laterally displaced GI- soft, NT, ND, + BS Extremities- no clubbing, cyanosis, or edema  Pacemaker interrogation- reviewed in detail today,  See PACEART report  Assessment and Plan:  1. Complete heart block Pt with occasional noise on his pacemaker lead, will decrease ventricular sensitivity to 5mV.  Impedance and pacing threshold are stable.  Given advanced age and comorbidity, we should avoid any invasive procedures if able.  Merlin checks every 3 months Return in 1 year  2. Nonischemic CM Mildly depressed EF Asymptomatic He will follow with our general cardiology team in 6 months.

## 2012-02-18 ENCOUNTER — Encounter: Payer: Self-pay | Admitting: *Deleted

## 2012-02-18 NOTE — Telephone Encounter (Signed)
Weight was up 6 pounds from Friday today at 165 today 159 on Friday with 2+ pitting edema in both feet. Left lung crackles in base.

## 2012-02-18 NOTE — Telephone Encounter (Signed)
This encounter was created in error - please disregard.

## 2012-03-26 NOTE — Telephone Encounter (Signed)
EE

## 2012-05-25 ENCOUNTER — Ambulatory Visit (INDEPENDENT_AMBULATORY_CARE_PROVIDER_SITE_OTHER): Payer: Medicare Other | Admitting: *Deleted

## 2012-05-25 ENCOUNTER — Other Ambulatory Visit: Payer: Self-pay | Admitting: Internal Medicine

## 2012-05-25 ENCOUNTER — Encounter: Payer: Self-pay | Admitting: Internal Medicine

## 2012-05-25 DIAGNOSIS — I441 Atrioventricular block, second degree: Secondary | ICD-10-CM

## 2012-05-25 DIAGNOSIS — Z950 Presence of cardiac pacemaker: Secondary | ICD-10-CM

## 2012-05-29 LAB — REMOTE PACEMAKER DEVICE
AL IMPEDENCE PM: 360 Ohm
AL THRESHOLD: 0.75 V
ATRIAL PACING PM: 9.3
BRDY-0003RA: 130 {beats}/min
BRDY-0004RA: 130 {beats}/min
DEVICE MODEL PM: 2328745

## 2012-06-22 ENCOUNTER — Encounter: Payer: Self-pay | Admitting: *Deleted

## 2012-08-24 ENCOUNTER — Other Ambulatory Visit: Payer: Self-pay | Admitting: Internal Medicine

## 2012-08-24 ENCOUNTER — Encounter: Payer: Self-pay | Admitting: Internal Medicine

## 2012-08-24 ENCOUNTER — Ambulatory Visit (INDEPENDENT_AMBULATORY_CARE_PROVIDER_SITE_OTHER): Payer: Medicare Other | Admitting: *Deleted

## 2012-08-24 DIAGNOSIS — I4891 Unspecified atrial fibrillation: Secondary | ICD-10-CM

## 2012-08-24 DIAGNOSIS — Z950 Presence of cardiac pacemaker: Secondary | ICD-10-CM

## 2012-08-24 LAB — REMOTE PACEMAKER DEVICE
AL THRESHOLD: 0.875 V
BAMS-0003: 70 {beats}/min
BRDY-0002RA: 60 {beats}/min
BRDY-0003RA: 130 {beats}/min
DEVICE MODEL PM: 2328745
RV LEAD AMPLITUDE: 7.8 mv

## 2012-09-02 ENCOUNTER — Encounter: Payer: Self-pay | Admitting: *Deleted

## 2012-11-05 ENCOUNTER — Encounter: Payer: Self-pay | Admitting: Cardiovascular Disease

## 2012-11-05 ENCOUNTER — Ambulatory Visit (INDEPENDENT_AMBULATORY_CARE_PROVIDER_SITE_OTHER): Payer: Medicare Other | Admitting: Cardiovascular Disease

## 2012-11-05 VITALS — BP 135/83 | HR 96 | Ht 68.0 in | Wt 165.1 lb

## 2012-11-05 DIAGNOSIS — I429 Cardiomyopathy, unspecified: Secondary | ICD-10-CM

## 2012-11-05 DIAGNOSIS — R609 Edema, unspecified: Secondary | ICD-10-CM

## 2012-11-05 DIAGNOSIS — E860 Dehydration: Secondary | ICD-10-CM

## 2012-11-05 DIAGNOSIS — Z950 Presence of cardiac pacemaker: Secondary | ICD-10-CM

## 2012-11-05 DIAGNOSIS — I428 Other cardiomyopathies: Secondary | ICD-10-CM

## 2012-11-05 NOTE — Patient Instructions (Addendum)

## 2012-11-05 NOTE — Progress Notes (Signed)
Patient ID: Austin Pittman, male   DOB: 03-12-1916, 79 y.o.   MRN: [id number]    SUBJECTIVE: Austin Pittman is a 79 y.o. Gentleman who has a history of presumed CAD following an abnormal stress test in 2005, for which he declined cardiac catheterization. He is status post PPM implantation, by Dr. Johney Frame, 7/10, for symptomatic bradycardia/intermittent CHB.   July 2013: 2-D echo: EF 40-45%, with mild LVH and diastolic dysfunction; normal RVF; mild AR/MR  This compares to prior study in 2010, revealing normal LVF (EF 55-65%), with grade 1 diastolic dysfunction, and mild AR.  Clinically, he denies exertional CP, and denies any prior history of such. He has DOE especially when carrying a bucket of tomatoes, but this appears to be chronic and stable. 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   He denies orthopnea or PND, but does have chronic LE edema. This is relieved by raising his legs on 6 pillows.  He also denies palpitations or LOC. He continues to live alone [place] near [place], [place], with his daughters living alongside him and checking on him daily.  He has chronic diarrhea and his daughter says he doesn't drink enough fluids.  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    Filed Vitals:   11/05/12 0837  BP: 135/83  Pulse: 96  Height: 5\' 8"  (1.727 m)  Weight: 165 lb 1.9 oz (74.898 kg)  SpO2: 96%    PHYSICAL EXAM General: NAD Neck: No JVD, no thyromegaly or thyroid nodule.  Lungs: Clear to auscultation bilaterally with normal respiratory effort. CV: Nondisplaced PMI.  Heart regular S1/S2, no S3/S4, no murmur.  No peripheral edema.  No carotid bruit.  Normal pedal pulses.  Abdomen: Soft, nontender, no hepatosplenomegaly, no distention.  Neurologic: Alert and oriented x 3.  Psych: Normal affect. Extremities: No clubbing or cyanosis.     LABS: Basic Metabolic Panel: No results found for this basename: NA, K, CL, CO2, GLUCOSE, BUN, CREATININE, CALCIUM, MG, PHOS,  in the last 72  hours Liver Function Tests: No results found for this basename: AST, ALT, ALKPHOS, BILITOT, PROT, ALBUMIN,  in the last 72 hours No results found for this basename: LIPASE, AMYLASE,  in the last 72 hours CBC: No results found for this basename: WBC, NEUTROABS, HGB, HCT, MCV, PLT,  in the last 72 hours Cardiac Enzymes: No results found for this basename: CKTOTAL, CKMB, CKMBINDEX, TROPONINI,  in the last 72 hours BNP: No components found with this basename: POCBNP,  D-Dimer: No results found for this basename: DDIMER,  in the last 72 hours Hemoglobin A1C: No results found for this basename: HGBA1C,  in the last 72 hours Fasting Lipid Panel: No results found for this basename: CHOL, HDL, LDLCALC, TRIG, CHOLHDL, LDLDIRECT,  in the last 72 hours Thyroid Function Tests: No results found for this basename: TSH, T4TOTAL, FREET3, T3FREE, THYROIDAB,  in the last 72 hours Anemia Panel: No results found for this basename: VITAMINB12, FOLATE, FERRITIN, TIBC, IRON, RETICCTPCT,  in the last 72 hours  July 2013 Echo: 2-D echo: EF 40-45%, with mild LVH and diastolic dysfunction; normal RVF; mild AR/MR  This compares to prior study in 2010, revealing normal LVF (EF 55-65%), with grade 1 diastolic dysfunction, and mild AR.     ASSESSMENT AND PLAN: 1. Ischemic cardiomyopathy: pt is compensated and asymptomatic, other than some leg edema relieved with leg elevation. Given his age and his preference to avoid medications, I am not inclined to inititiate diuretic  therapy. 2. Symptomatic bradycardia/intermittent CHB s/p pacemaker: will have pacemaker interrogated as part of annual protocol. 3. Diarrhea and dehydration: I did encourage adequate fluid intake, given that his daughter tells me he drinks very little and is out in the heat daily.    Prentice Docker, M.D., F.A.C.C.

## 2012-11-23 ENCOUNTER — Ambulatory Visit (INDEPENDENT_AMBULATORY_CARE_PROVIDER_SITE_OTHER): Payer: Medicare Other | Admitting: *Deleted

## 2012-11-23 DIAGNOSIS — Z950 Presence of cardiac pacemaker: Secondary | ICD-10-CM

## 2012-11-23 DIAGNOSIS — I4891 Unspecified atrial fibrillation: Secondary | ICD-10-CM

## 2012-11-24 LAB — REMOTE PACEMAKER DEVICE
AL AMPLITUDE: 3.5 mv
BAMS-0001: 150 {beats}/min
BAMS-0003: 70 {beats}/min
BATTERY VOLTAGE: 2.93 V
BRDY-0002RA: 60 {beats}/min
RV LEAD AMPLITUDE: 7.2 mv

## 2012-12-16 ENCOUNTER — Encounter: Payer: Self-pay | Admitting: *Deleted

## 2013-01-19 ENCOUNTER — Encounter: Payer: Self-pay | Admitting: Internal Medicine

## 2013-01-21 ENCOUNTER — Other Ambulatory Visit: Payer: Self-pay | Admitting: Gastroenterology

## 2013-02-24 ENCOUNTER — Ambulatory Visit (INDEPENDENT_AMBULATORY_CARE_PROVIDER_SITE_OTHER): Payer: Medicare Other | Admitting: Internal Medicine

## 2013-02-24 ENCOUNTER — Encounter: Payer: Self-pay | Admitting: Internal Medicine

## 2013-02-24 VITALS — BP 126/68 | HR 85 | Ht 67.0 in | Wt 174.1 lb

## 2013-02-24 DIAGNOSIS — I429 Cardiomyopathy, unspecified: Secondary | ICD-10-CM

## 2013-02-24 DIAGNOSIS — I428 Other cardiomyopathies: Secondary | ICD-10-CM

## 2013-02-24 DIAGNOSIS — I441 Atrioventricular block, second degree: Secondary | ICD-10-CM

## 2013-02-24 LAB — MDC_IDC_ENUM_SESS_TYPE_INCLINIC
Battery Remaining Longevity: 78 mo
Battery Voltage: 2.93 V
Brady Statistic RA Percent Paced: 16 %
Brady Statistic RV Percent Paced: 98 %
Lead Channel Impedance Value: 362.5 Ohm
Lead Channel Impedance Value: 600 Ohm
Lead Channel Sensing Intrinsic Amplitude: 3.1 mV
Lead Channel Setting Pacing Amplitude: 2 V
Lead Channel Setting Pacing Amplitude: 2.5 V

## 2013-02-24 NOTE — Patient Instructions (Signed)
Continue all current medications. Your physician wants you to follow up in:  1 year.  You will receive a reminder letter in the mail one-two months in advance.  If you don't receive a letter, please call our office to schedule the follow up appointment - [place]  

## 2013-02-24 NOTE — Progress Notes (Signed)
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 male who presents today for routine electrophysiology followup.  Since last being seen in our clinic, the patient reports doing very well.  He has returned to his baseline.  His legs "give out" at times.  He is otherwise well.  He has chronic BLE edema which is unchanged.  Today, he denies symptoms of palpitations, chest pain, shortness of breath,   dizziness, presyncope, or syncope.  The patient is otherwise without complaint today.   Past Medical History  Diagnosis Date  . Atrial tachycardia     nonsustained  . Hiatal hernia   . 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 Glaucoma   . Second degree Mobitz II AV block     s/p PPM   . Macular degeneration 09/12/2011  . Acute confusion due to known medical condition 09/12/2011  . Diarrhea   . Pacemaker    Past Surgical History  Procedure Laterality Date  . Appendectomy    . Pacemaker insertion  11/07/08    St Jude implanted by Dr Johney Frame  . Laparoscopy  09/22/2011    Procedure: LAPAROSCOPY DIAGNOSTIC;  Surgeon: Mariella Saa, MD;  Location: [facility];  Service: General;  Laterality: N/A;  resectiion terminal ileum cecum with anastamosis  . Colon surgery  June, 21    SBO    Current Outpatient Prescriptions  Medication Sig Dispense Refill  . acetaminophen (TYLENOL) 500 MG tablet Take 500 mg by mouth every 6 (six) hours as needed. Pain       . colesevelam (WELCHOL) 625 MG tablet Take 1,875 mg by mouth daily.      . ferrous sulfate 325 (65 FE) MG tablet Take 325 mg by mouth daily with breakfast.        No current facility-administered medications for this visit.    Physical Exam: Filed Vitals:   02/24/13 0937  BP: 126/68  Pulse: 85  Height: 5\' 7"  (1.702 m)  Weight: 174 lb 1.9 oz (78.98 kg)    GEN- The patient is well appearing, alert and oriented x 3 today.   Head- normocephalic, atraumatic Eyes-  Sclera  clear, conjunctiva pink Ears- hearing intact Oropharynx- clear Lungs- Clear to ausculation bilaterally, normal work of breathing Chest- pacemaker pocket is well healed Heart- Regular rate and rhythm, no murmurs, rubs or gallops, PMI not laterally displaced GI- soft, NT, ND, + BS Extremities- no clubbing, cyanosis, +2 edema  Pacemaker interrogation- reviewed in detail today,  See PACEART report  Assessment and Plan:  1. Complete heart block Pt with occasional noise on his pacemaker lead,   Impedance and pacing threshold are stable.  Given advanced age and comorbidity, we should avoid any invasive procedures if able.  Merlin checks every 3 months Return in 1 year  2. Nonischemic CM Mildly depressed EF Asymptomatic Salt restriction is advised  Merlin Return in 1 year

## 2013-05-27 ENCOUNTER — Ambulatory Visit (INDEPENDENT_AMBULATORY_CARE_PROVIDER_SITE_OTHER): Payer: Medicare Other | Admitting: *Deleted

## 2013-05-27 ENCOUNTER — Encounter: Payer: Self-pay | Admitting: Internal Medicine

## 2013-05-27 DIAGNOSIS — I428 Other cardiomyopathies: Secondary | ICD-10-CM

## 2013-05-27 DIAGNOSIS — I429 Cardiomyopathy, unspecified: Secondary | ICD-10-CM

## 2013-05-27 DIAGNOSIS — I4891 Unspecified atrial fibrillation: Secondary | ICD-10-CM

## 2013-06-02 ENCOUNTER — Encounter: Payer: Self-pay | Admitting: *Deleted

## 2013-06-03 LAB — MDC_IDC_ENUM_SESS_TYPE_REMOTE
Brady Statistic AP VS Percent: 1 %
Brady Statistic AS VP Percent: 83 %
Brady Statistic AS VS Percent: 1 %
Date Time Interrogation Session: 20150212070337
Implantable Pulse Generator Serial Number: 2328745
Lead Channel Impedance Value: 610 Ohm
Lead Channel Pacing Threshold Amplitude: 0.5 V
Lead Channel Pacing Threshold Pulse Width: 0.4 ms
Lead Channel Sensing Intrinsic Amplitude: 6.1 mV
Lead Channel Setting Pacing Amplitude: 2 V
Lead Channel Setting Pacing Amplitude: 2.5 V
Lead Channel Setting Pacing Pulse Width: 0.4 ms
Lead Channel Setting Sensing Sensitivity: 5 mV
MDC IDC MSMT BATTERY REMAINING LONGEVITY: 77 mo
MDC IDC MSMT BATTERY VOLTAGE: 2.93 V
MDC IDC MSMT LEADCHNL RA IMPEDANCE VALUE: 360 Ohm
MDC IDC MSMT LEADCHNL RA PACING THRESHOLD AMPLITUDE: 0.875 V
MDC IDC MSMT LEADCHNL RA PACING THRESHOLD PULSEWIDTH: 0.4 ms
MDC IDC MSMT LEADCHNL RA SENSING INTR AMPL: 3.2 mV
MDC IDC STAT BRADY AP VP PERCENT: 16 %
MDC IDC STAT BRADY RA PERCENT PACED: 16 %
MDC IDC STAT BRADY RV PERCENT PACED: 99 %

## 2013-06-14 ENCOUNTER — Encounter: Payer: Self-pay | Admitting: *Deleted

## 2013-06-15 ENCOUNTER — Telehealth: Payer: Self-pay | Admitting: Internal Medicine

## 2013-06-15 NOTE — Telephone Encounter (Signed)
Mr. Kross daughter Carey Bullocks) called stating that she received a telephone call from [facility] office.  Will forward to [facility]

## 2013-06-15 NOTE — Telephone Encounter (Signed)
Spoke w/Vicki in regards to call/letter. Letter sent on 06-02-13 stating needs device check check. Pt had remote check on 05-27-13. Daughter was told to disregard letter.

## 2013-08-30 ENCOUNTER — Encounter: Payer: Self-pay | Admitting: Internal Medicine

## 2013-08-30 ENCOUNTER — Ambulatory Visit (INDEPENDENT_AMBULATORY_CARE_PROVIDER_SITE_OTHER): Payer: Medicare Other | Admitting: *Deleted

## 2013-08-30 DIAGNOSIS — I429 Cardiomyopathy, unspecified: Secondary | ICD-10-CM

## 2013-08-30 DIAGNOSIS — I428 Other cardiomyopathies: Secondary | ICD-10-CM

## 2013-08-30 DIAGNOSIS — I4891 Unspecified atrial fibrillation: Secondary | ICD-10-CM

## 2013-08-30 LAB — MDC_IDC_ENUM_SESS_TYPE_REMOTE
Battery Voltage: 2.93 V
Brady Statistic AP VP Percent: 14 %
Brady Statistic AS VP Percent: 86 %
Brady Statistic AS VS Percent: 1 %
Date Time Interrogation Session: 20150518073711
Implantable Pulse Generator Serial Number: 2328745
Lead Channel Impedance Value: 360 Ohm
Lead Channel Impedance Value: 630 Ohm
Lead Channel Pacing Threshold Amplitude: 0.5 V
Lead Channel Pacing Threshold Pulse Width: 0.4 ms
Lead Channel Sensing Intrinsic Amplitude: 3.3 mV
Lead Channel Setting Pacing Amplitude: 2 V
Lead Channel Setting Pacing Amplitude: 2.5 V
Lead Channel Setting Pacing Pulse Width: 0.4 ms
Lead Channel Setting Sensing Sensitivity: 5 mV
MDC IDC MSMT BATTERY REMAINING LONGEVITY: 77 mo
MDC IDC MSMT BATTERY REMAINING PERCENTAGE: 67 %
MDC IDC MSMT LEADCHNL RA PACING THRESHOLD AMPLITUDE: 0.875 V
MDC IDC MSMT LEADCHNL RA PACING THRESHOLD PULSEWIDTH: 0.4 ms
MDC IDC MSMT LEADCHNL RV SENSING INTR AMPL: 12 mV
MDC IDC STAT BRADY AP VS PERCENT: 1 %
MDC IDC STAT BRADY RA PERCENT PACED: 13 %
MDC IDC STAT BRADY RV PERCENT PACED: 99 %

## 2013-08-30 NOTE — Progress Notes (Signed)
Remote pacemaker transmission.   

## 2013-09-10 ENCOUNTER — Encounter: Payer: Self-pay | Admitting: Cardiology

## 2013-11-10 ENCOUNTER — Ambulatory Visit (INDEPENDENT_AMBULATORY_CARE_PROVIDER_SITE_OTHER): Payer: Private Health Insurance - Indemnity | Admitting: Cardiovascular Disease

## 2013-11-10 ENCOUNTER — Encounter: Payer: Self-pay | Admitting: Cardiovascular Disease

## 2013-11-10 VITALS — BP 116/70 | HR 76 | Ht 68.0 in | Wt 181.0 lb

## 2013-11-10 DIAGNOSIS — I428 Other cardiomyopathies: Secondary | ICD-10-CM

## 2013-11-10 DIAGNOSIS — Z950 Presence of cardiac pacemaker: Secondary | ICD-10-CM

## 2013-11-10 DIAGNOSIS — R29898 Other symptoms and signs involving the musculoskeletal system: Secondary | ICD-10-CM

## 2013-11-10 DIAGNOSIS — R609 Edema, unspecified: Secondary | ICD-10-CM

## 2013-11-10 DIAGNOSIS — R600 Localized edema: Secondary | ICD-10-CM

## 2013-11-10 DIAGNOSIS — I429 Cardiomyopathy, unspecified: Secondary | ICD-10-CM

## 2013-11-10 MED ORDER — HYDROCHLOROTHIAZIDE 12.5 MG PO TABS: 12.5000 mg | ORAL_TABLET | ORAL | Status: DC

## 2013-11-10 NOTE — Patient Instructions (Signed)
   Decrease HCTZ to 12.5mg  every other day. New sent to pharmacy.  Continue all other current medications.  Daughter to call office with update in 2 weeks.  Your physician wants you to follow up in: 8 months.  You will receive a reminder letter in the mail one-two months in advance.  If you don't receive a letter, please call our office to schedule the follow up appointment

## 2013-11-10 NOTE — Progress Notes (Signed)
Patient ID: Austin Pittman, male   DOB: 01-09-1916, 80 y.o.   MRN: [id number]      SUBJECTIVE: Austin Pittman is a 80 yr old gentleman who has a history of presumed CAD following an abnormal stress test in 2005, for which he declined cardiac catheterization. He is status post PPM implantation, by Dr. Rayann Pittman, 7/10, for symptomatic bradycardia/intermittent CHB.  July 2013:  2-D echo: EF 40-45%, with mild LVH and diastolic dysfunction; normal RVF; mild AR/MR  This compares to prior study in 2010, revealing normal LVF (EF 55-65%), with grade 1 diastolic dysfunction, and mild AR.   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  He has chronic lower extremity edema which is relieved by raising his legs on 6 pillows.  However, his legs continued to feel weak. He has been prescribed hydrochlorothiazide 25 mg to be used as needed for weight gain of 3 pounds within 4 days by his PCP, but due to minimal weight fluctuations, he has not used this. He continues to live alone on his farm near [place], where [place] was [place], with his daughters living alongside him and checking on him daily.   ECG performed in the office today demonstrates a ventricular paced rhythm.  He served in [place] during [employer].    Review of Systems: As per "subjective", otherwise negative.  No Known Allergies  Current Outpatient Prescriptions  Medication Sig Dispense Refill  . acetaminophen (TYLENOL) 500 MG tablet Take 500 mg by mouth every 6 (six) hours as needed. Pain       . beta carotene w/minerals (OCUVITE) tablet Take 1 tablet by mouth daily.      . colesevelam (WELCHOL) 625 MG tablet Take 1,875 mg by mouth daily.      . ferrous sulfate 325 (65 FE) MG tablet Take 325 mg by mouth daily with breakfast.       . hydrochlorothiazide (HYDRODIURIL) 25 MG tablet Take 1 tablet by mouth daily as needed.       No current facility-administered medications for this visit.    Past Medical History  Diagnosis Date  . Atrial tachycardia     nonsustained    . Hiatal hernia   . 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 Glaucoma   . Second degree Mobitz II AV block     s/p PPM   . Macular degeneration 09/12/2011  . Acute confusion due to known medical condition 09/12/2011  . Diarrhea   . Pacemaker     Past Surgical History  Procedure Laterality Date  . Appendectomy    . Pacemaker insertion  11/07/08    St Jude implanted by Dr Austin Pittman  . Laparoscopy  09/22/2011    Procedure: LAPAROSCOPY DIAGNOSTIC;  Surgeon: Edward Jolly, MD;  Location: [facility];  Service: General;  Laterality: N/A;  resectiion terminal ileum cecum with anastamosis  . Colon surgery  June, 21    SBO    History   Social History  . Marital Status: Widowed    Spouse Name: N/A    Number of Children: 2  . Years of Education: N/A   Occupational History  . Not on file.   Social History Main Topics  . Smoking status: Former Smoker -- 1.00 packs/day for 20 years    Types: Cigarettes    Quit date: 04/15/1946  . Smokeless tobacco: Former Systems developer    Types: [place] date: 04/15/1948     Comment: chewed tobacco for  2 years  . Alcohol Use: No  . Drug Use: No  . Sexual Activity: No   Other Topics Concern  . Not on file   Social History Narrative   Lives alone at home   Wife passed in 1993-Had a CAD   [name]-[phone]     Filed Vitals:   11/10/13 1342  BP: 116/70  Pulse: 76  Height: 5\' 8"  (1.727 m)  Weight: 181 lb (82.101 kg)  SpO2: 96%    PHYSICAL EXAM General: NAD Neck: No JVD, no thyromegaly. Lungs: Clear to auscultation bilaterally with normal respiratory effort. CV: Nondisplaced PMI.  Regular rate and rhythm, normal S1/S2, no S3/S4, no murmur. 1+ pitting pretibial and periankle edema.  Abdomen: Soft, nontender, no hepatosplenomegaly, no distention.  Neurologic: Alert and oriented x 3.  Psych: Normal affect. Extremities: No clubbing or cyanosis.   ECG: reviewed and available in electronic  records.      ASSESSMENT AND PLAN: 1. Ischemic cardiomyopathy with bilateral leg swelling: This likely represents a mild degree of systolic heart failure. I have recommended he try hydrochlorothiazide 12.5 mg every other day. I have asked his daughter to call me in 2 weeks to inform me if this has relieved the leg swelling. If not, I would consider 12.5 mg daily. Given his advanced age, I do not recommend aggressive therapy with respect to imaging and percutaneous intervention.  2. Symptomatic bradycardia/intermittent CHB s/p pacemaker: Follows with Dr. Rayann Pittman. Device interrogation on 08/30/2013 demonstrated normal device function, 20 mode switches, atrial tachycardia, with no high ventricular rates noted.  Dispo: f/u March 2016.  Kate Sable, M.D., F.A.C.C.

## 2013-12-01 ENCOUNTER — Encounter: Payer: Self-pay | Admitting: Internal Medicine

## 2013-12-01 ENCOUNTER — Ambulatory Visit (INDEPENDENT_AMBULATORY_CARE_PROVIDER_SITE_OTHER): Payer: Private Health Insurance - Indemnity | Admitting: *Deleted

## 2013-12-01 DIAGNOSIS — I4891 Unspecified atrial fibrillation: Secondary | ICD-10-CM

## 2013-12-01 DIAGNOSIS — Z950 Presence of cardiac pacemaker: Secondary | ICD-10-CM

## 2013-12-01 NOTE — Progress Notes (Signed)
Remote pacemaker transmission.   

## 2013-12-03 ENCOUNTER — Telehealth: Payer: Self-pay | Admitting: *Deleted

## 2013-12-03 NOTE — Telephone Encounter (Signed)
Spoke with daughter Jerlyn Ly) - stated she was told to call back in few weeks with update on her dad.  Weight on 7/29 at OV was 181.  Weights at home ranging from 180.6, 176.2, 178.8.  Highest was 181.2.  States that she does not notice any increased SOB, but does not seem like the med is making much of a difference.  Still having the feet and ankle swelling.  Currently doing HCTZ 12.5mg  every other day.

## 2013-12-06 MED ORDER — HYDROCHLOROTHIAZIDE 12.5 MG PO TABS: 12.5000 mg | ORAL_TABLET | Freq: Every day | ORAL | Status: DC

## 2013-12-06 NOTE — Telephone Encounter (Signed)
From Dr Marthann Schiller last note recs to increase HCTZ to 12.5mg  daily if note improved with every other day therapy. Would make that change and keep follow up with him   Zandra Abts MD

## 2013-12-06 NOTE — Telephone Encounter (Signed)
Daughter Olegario Shearer) notified.  Informed to call back if no improvement.

## 2013-12-09 LAB — MDC_IDC_ENUM_SESS_TYPE_REMOTE
Battery Remaining Longevity: 77 mo
Battery Remaining Percentage: 67 %
Battery Voltage: 2.93 V
Brady Statistic AS VP Percent: 87 %
Brady Statistic RA Percent Paced: 12 %
Implantable Pulse Generator Model: 2210
Lead Channel Impedance Value: 350 Ohm
Lead Channel Sensing Intrinsic Amplitude: 12 mV
Lead Channel Setting Pacing Amplitude: 2 V
Lead Channel Setting Pacing Amplitude: 2.5 V
Lead Channel Setting Pacing Pulse Width: 0.4 ms
MDC IDC MSMT LEADCHNL RA PACING THRESHOLD AMPLITUDE: 0.875 V
MDC IDC MSMT LEADCHNL RA PACING THRESHOLD PULSEWIDTH: 0.4 ms
MDC IDC MSMT LEADCHNL RA SENSING INTR AMPL: 3.3 mV
MDC IDC MSMT LEADCHNL RV IMPEDANCE VALUE: 610 Ohm
MDC IDC PG SERIAL: 2328745
MDC IDC SESS DTM: 20150819060012
MDC IDC SET LEADCHNL RV SENSING SENSITIVITY: 5 mV
MDC IDC STAT BRADY AP VP PERCENT: 12 %
MDC IDC STAT BRADY AP VS PERCENT: 1 %
MDC IDC STAT BRADY AS VS PERCENT: 1 %
MDC IDC STAT BRADY RV PERCENT PACED: 99 %

## 2013-12-14 ENCOUNTER — Encounter: Payer: Self-pay | Admitting: Cardiology

## 2014-03-14 ENCOUNTER — Ambulatory Visit (INDEPENDENT_AMBULATORY_CARE_PROVIDER_SITE_OTHER): Payer: Private Health Insurance - Indemnity | Admitting: Internal Medicine

## 2014-03-14 ENCOUNTER — Encounter: Payer: Self-pay | Admitting: Internal Medicine

## 2014-03-14 VITALS — BP 113/69 | HR 90 | Ht 68.0 in | Wt 171.0 lb

## 2014-03-14 DIAGNOSIS — R609 Edema, unspecified: Secondary | ICD-10-CM

## 2014-03-14 DIAGNOSIS — I429 Cardiomyopathy, unspecified: Secondary | ICD-10-CM

## 2014-03-14 DIAGNOSIS — Z950 Presence of cardiac pacemaker: Secondary | ICD-10-CM | POA: Diagnosis not present

## 2014-03-14 DIAGNOSIS — I480 Paroxysmal atrial fibrillation: Secondary | ICD-10-CM | POA: Diagnosis not present

## 2014-03-14 DIAGNOSIS — I442 Atrioventricular block, complete: Secondary | ICD-10-CM

## 2014-03-14 DIAGNOSIS — I441 Atrioventricular block, second degree: Secondary | ICD-10-CM

## 2014-03-14 LAB — MDC_IDC_ENUM_SESS_TYPE_INCLINIC
Battery Remaining Longevity: 85.2 mo
Brady Statistic RV Percent Paced: 99.6 %
Date Time Interrogation Session: 20151130112321
Implantable Pulse Generator Model: 2210
Implantable Pulse Generator Serial Number: 2328745
Lead Channel Pacing Threshold Amplitude: 0.875 V
Lead Channel Pacing Threshold Pulse Width: 0.4 ms
Lead Channel Sensing Intrinsic Amplitude: 2.8 mV
Lead Channel Sensing Intrinsic Amplitude: 9.5 mV
Lead Channel Setting Pacing Pulse Width: 0.4 ms
MDC IDC MSMT BATTERY VOLTAGE: 2.92 V
MDC IDC MSMT LEADCHNL RA IMPEDANCE VALUE: 362.5 Ohm
MDC IDC MSMT LEADCHNL RV IMPEDANCE VALUE: 612.5 Ohm
MDC IDC MSMT LEADCHNL RV PACING THRESHOLD AMPLITUDE: 0.5 V
MDC IDC MSMT LEADCHNL RV PACING THRESHOLD PULSEWIDTH: 0.4 ms
MDC IDC SET LEADCHNL RA PACING AMPLITUDE: 2 V
MDC IDC SET LEADCHNL RV PACING AMPLITUDE: 2.5 V
MDC IDC SET LEADCHNL RV SENSING SENSITIVITY: 4.5 mV
MDC IDC STAT BRADY RA PERCENT PACED: 11 %

## 2014-03-14 NOTE — Patient Instructions (Signed)
Your physician recommends that you schedule a follow-up appointment in: 1 year with Dr. Rayann Heman. You will receive a reminder letter in the mail in about 10 months reminding you to call and schedule your appointment. If you don't receive this letter, please contact our office. Merlin device check 06/13/14. Your physician recommends that you continue on your current medications as directed. Please refer to the Current Medication list given to you today.

## 2014-03-14 NOTE — Progress Notes (Signed)
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  Since last being seen in our clinic, the patient reports doing very well.  His legs continue to "give out" at times.  This has been chronic.  He is otherwise well.  He has chronic BLE edema which improved with daily hctz by Dr Bronson Ing.  Today, he denies symptoms of palpitations, chest pain, shortness of breath, dizziness, presyncope, or syncope.  The patient is otherwise without complaint today.   Past Medical History  Diagnosis Date  . Atrial tachycardia     nonsustained  . Hiatal hernia   . 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 Glaucoma   . Second degree Mobitz II AV block     s/p PPM   . Macular degeneration 09/12/2011  . Acute confusion due to known medical condition 09/12/2011  . Diarrhea   . Pacemaker    Past Surgical History  Procedure Laterality Date  . Appendectomy    . Pacemaker insertion  11/07/08    St Jude implanted by Dr Rayann Heman  . Laparoscopy  09/22/2011    Procedure: LAPAROSCOPY DIAGNOSTIC;  Surgeon: Edward Jolly, MD;  Location: [facility];  Service: General;  Laterality: N/A;  resectiion terminal ileum cecum with anastamosis  . Colon surgery  June, 21    SBO    Current Outpatient Prescriptions  Medication Sig Dispense Refill  . acetaminophen (TYLENOL) 500 MG tablet Take 500 mg by mouth every 6 (six) hours as needed. Pain     . beta carotene w/minerals (OCUVITE) tablet Take 1 tablet by mouth daily.    . cholestyramine (QUESTRAN) 4 GM/DOSE powder Take by mouth daily.    . ferrous sulfate 325 (65 FE) MG tablet Take 325 mg by mouth daily with breakfast.     . hydrochlorothiazide (HYDRODIURIL) 12.5 MG tablet Take 1 tablet (12.5 mg total) by mouth daily.     No current facility-administered medications for this visit.    Physical Exam: Filed Vitals:   03/14/14 1113  BP:  113/69  Pulse: 90  Height: 5\' 8"  (1.727 m)  Weight: 171 lb (77.565 kg)  SpO2: 97%    GEN- The patient is well appearing, alert and oriented x 3 today.   Head- normocephalic, atraumatic Eyes-  Sclera clear, conjunctiva pink Ears- hearing intact Oropharynx- clear Lungs- Clear to ausculation bilaterally, normal work of breathing Chest- pacemaker pocket is well healed Heart- Regular rate and rhythm, no murmurs, rubs or gallops, PMI not laterally displaced GI- soft, NT, ND, + BS Extremities- no clubbing, cyanosis, +1 edema  Pacemaker interrogation- reviewed in detail today,  See PACEART report  Assessment and Plan:  1. Complete heart block Pt with occasional noise on his RV lead, chronically.  (This was noticed last year also.)   Impedance and pacing threshold are stable.  Given advanced age and comorbidity, we should avoid any invasive procedures if able.  He is not dependant  Merlin checks every 3 months Return in 1 year  2. Nonischemic CM Mildly depressed EF Asymptomatic Salt restriction is advised Edema is better with hctz  3. afib Short episodes (longest 1 minute) noted on PPM  Given unsteadiness and falls, I would not advise anticoagulation at this time. We may need to reconsider if arrhythmia burden increases.  Merlin Return in 1 year

## 2014-03-15 ENCOUNTER — Encounter: Payer: Self-pay | Admitting: Internal Medicine

## 2014-05-16 ENCOUNTER — Other Ambulatory Visit: Payer: Self-pay | Admitting: Cardiovascular Disease

## 2014-06-12 IMAGING — US US ABDOMEN COMPLETE
1 series · 14 of 25 positions shown · non-contrast
Comparison: CT of 11/22/2011.  No prior ultrasound.

CLINICAL DATA: Right upper quadrant/epigastric abdominal pain.
Prior appendectomy.

COMPLETE ABDOMINAL ULTRASOUND

[Series 1: us abdomen complete · 0.32mm/px · 14 of 78 slices shown]
[im 1/78]
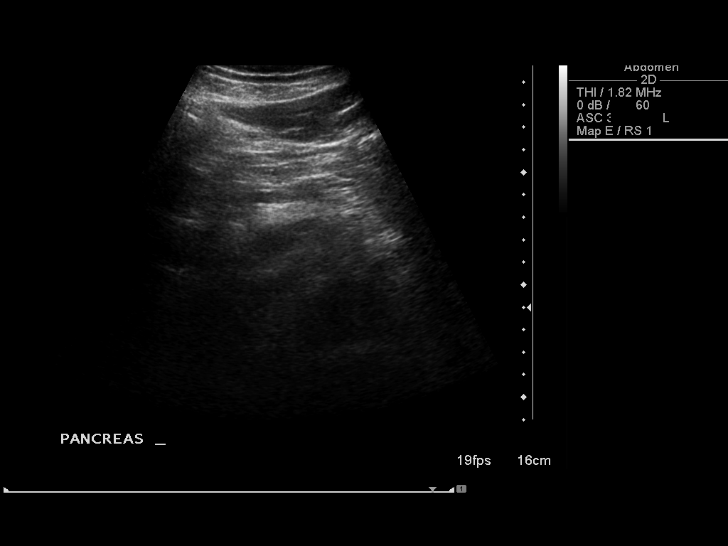
[im 7/78]
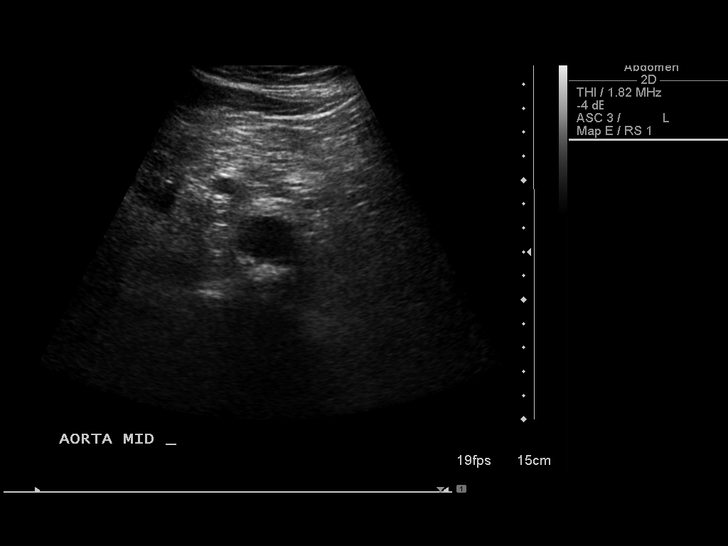
[im 13/78]
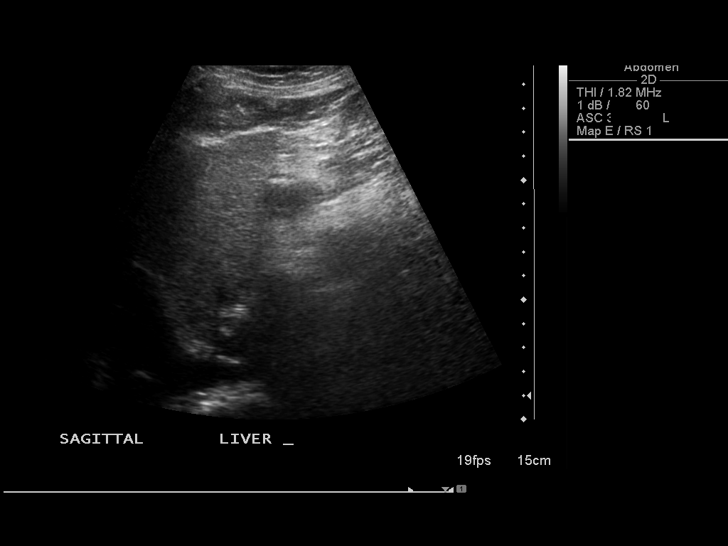
[im 20/78]
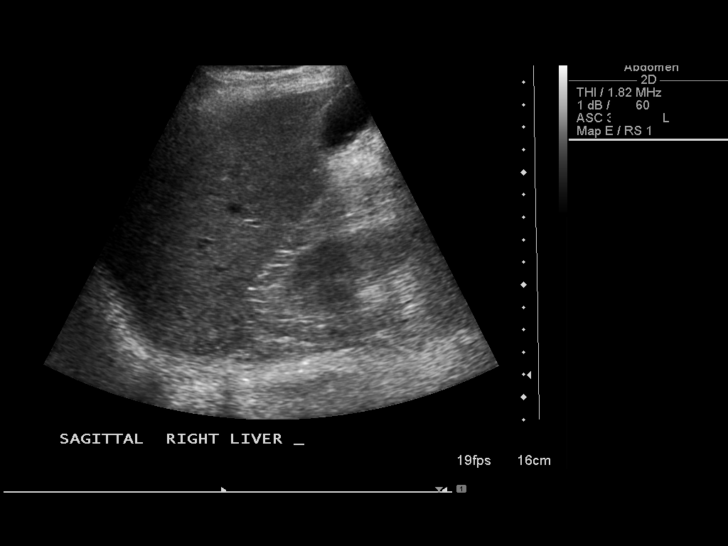
[im 26/78]
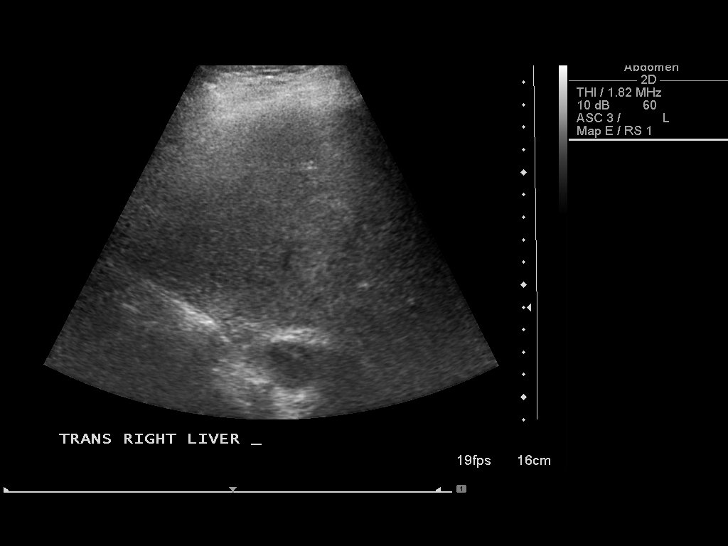
[im 29/78]
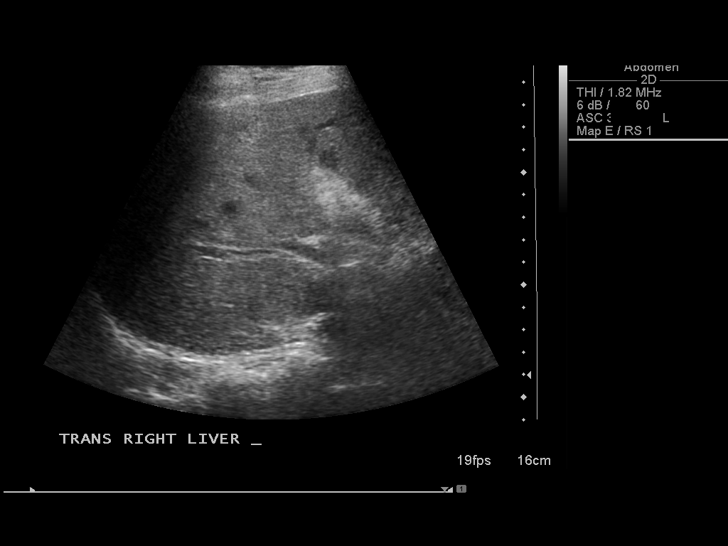
[im 36/78]
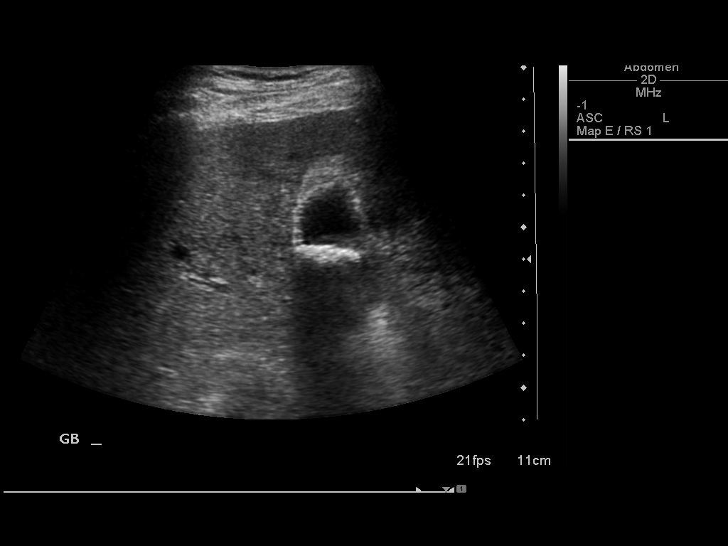
[im 42/78]
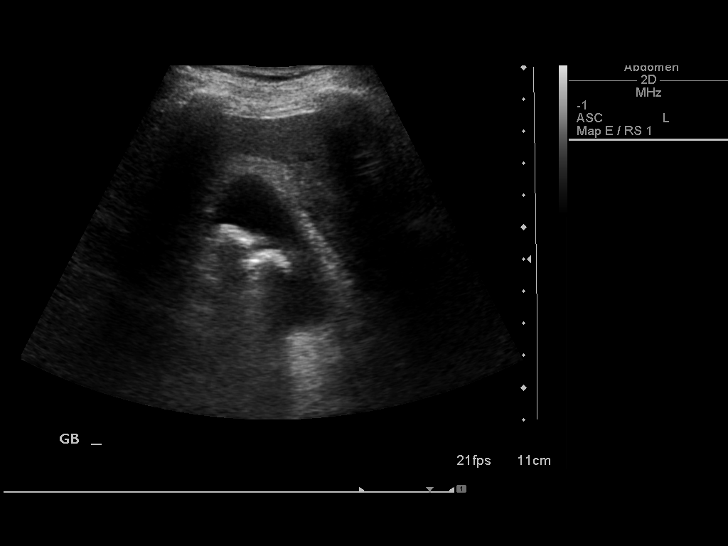
[im 49/78]
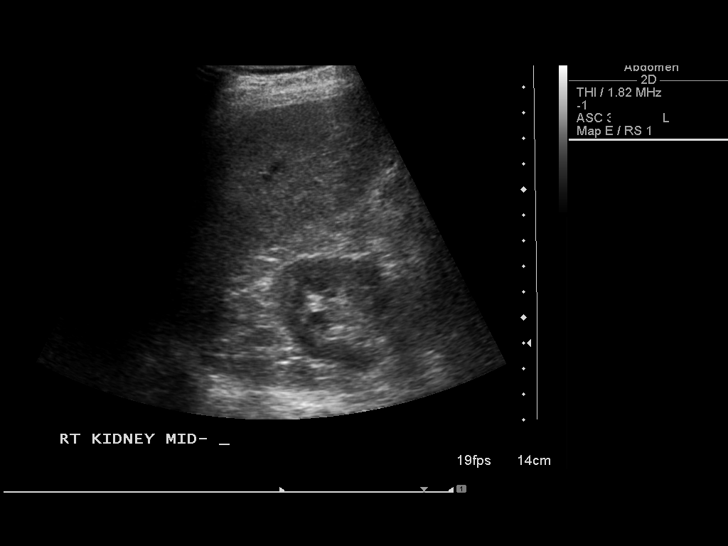
[im 52/78]
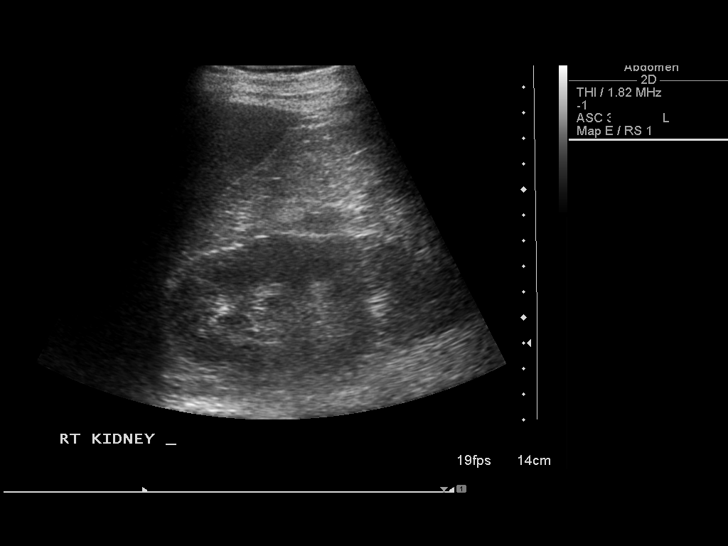
[im 58/78]
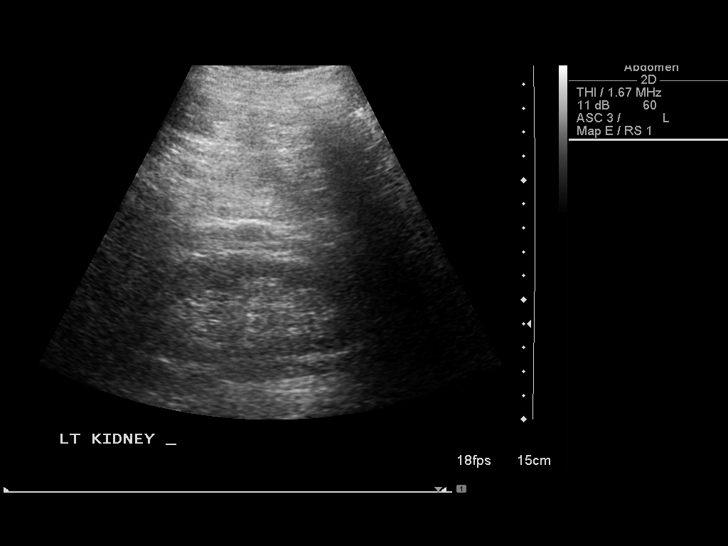
[im 65/78]
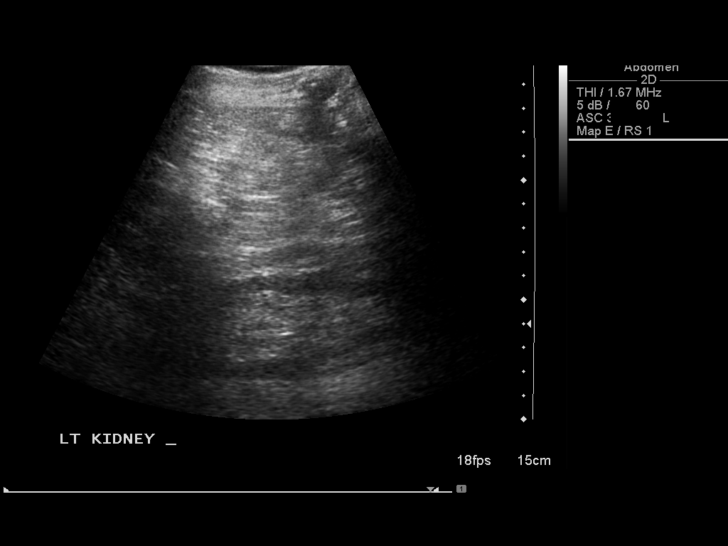
[im 71/78]
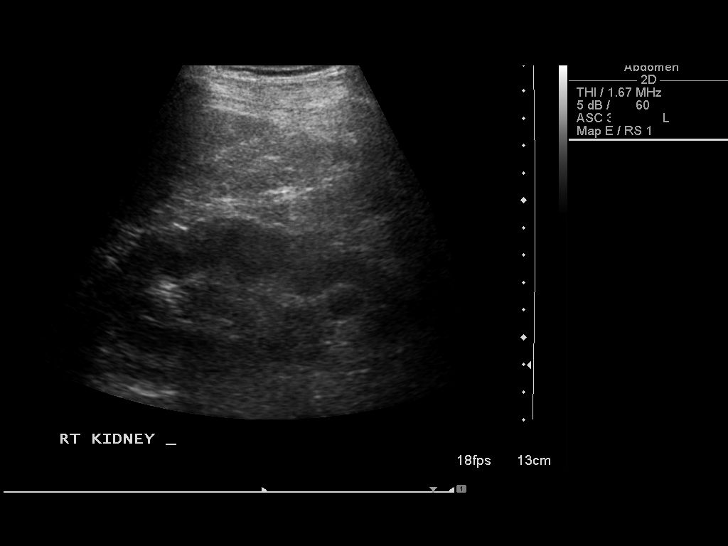
[im 78/78]
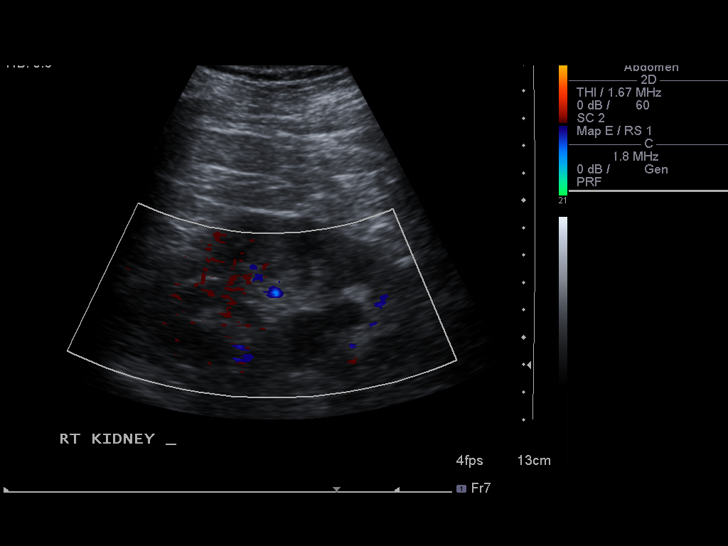

[14 of 25 positions shown; findings below may reference images not displayed]

FINDINGS: Gallbladder:  Numerous gallstones.  Mild wall thickening at 5 mm.
No pericholecystic fluid. Sonographic Murphy's sign was not
elicited.

Common bile duct: Normal, 5 mm.

Liver: Normal in echogenicity, without focal lesion.

IVC: Negative

Pancreas:  Negative

Spleen:  Normal in size and echogenicity.

Right Kidney:  10.9 cm.  Mild renal cortical thinning. No
hydronephrosis.

Left Kidney:  10.9 cm.  Mild renal cortical thinning. No
hydronephrosis.

Abdominal aorta:  Mild non aneurysmal dilatation of the infrarenal
aorta at maximally 3.0 cm.
IMPRESSION: 1.  Gallstones.  Mild nonspecific gallbladder wall thickening,
without pericholecystic fluid or sonographic Murphy's sign.  No
biliary ductal dilatation.
2.  Mild renal cortical thinning bilaterally.  Likely within normal
variation for patient age.

## 2014-06-13 ENCOUNTER — Encounter: Payer: Self-pay | Admitting: Internal Medicine

## 2014-06-13 ENCOUNTER — Ambulatory Visit (INDEPENDENT_AMBULATORY_CARE_PROVIDER_SITE_OTHER): Payer: Medicare HMO | Admitting: *Deleted

## 2014-06-13 DIAGNOSIS — I442 Atrioventricular block, complete: Secondary | ICD-10-CM

## 2014-06-13 LAB — MDC_IDC_ENUM_SESS_TYPE_REMOTE
Battery Remaining Longevity: 66 mo
Battery Remaining Percentage: 59 %
Brady Statistic RA Percent Paced: 12 %
Brady Statistic RV Percent Paced: 99 %
Implantable Pulse Generator Serial Number: 2328745
Lead Channel Impedance Value: 510 Ohm
Lead Channel Sensing Intrinsic Amplitude: 6.4 mV
Lead Channel Setting Pacing Amplitude: 2 V
Lead Channel Setting Pacing Amplitude: 2.5 V
Lead Channel Setting Pacing Pulse Width: 0.4 ms
Lead Channel Setting Sensing Sensitivity: 4.5 mV
MDC IDC MSMT BATTERY VOLTAGE: 2.92 V
MDC IDC MSMT LEADCHNL RA IMPEDANCE VALUE: 340 Ohm
MDC IDC MSMT LEADCHNL RA PACING THRESHOLD AMPLITUDE: 0.875 V
MDC IDC MSMT LEADCHNL RA PACING THRESHOLD PULSEWIDTH: 0.4 ms
MDC IDC MSMT LEADCHNL RA SENSING INTR AMPL: 3.3 mV
MDC IDC MSMT LEADCHNL RV PACING THRESHOLD AMPLITUDE: 0.5 V
MDC IDC MSMT LEADCHNL RV PACING THRESHOLD PULSEWIDTH: 0.4 ms
MDC IDC SESS DTM: 20160229070010
MDC IDC STAT BRADY AP VP PERCENT: 12 %
MDC IDC STAT BRADY AP VS PERCENT: 1 %
MDC IDC STAT BRADY AS VP PERCENT: 87 %
MDC IDC STAT BRADY AS VS PERCENT: 1 %

## 2014-06-13 NOTE — Progress Notes (Signed)
Remote pacemaker transmission.   

## 2014-09-13 ENCOUNTER — Encounter: Payer: Self-pay | Admitting: Internal Medicine

## 2014-09-13 ENCOUNTER — Ambulatory Visit (INDEPENDENT_AMBULATORY_CARE_PROVIDER_SITE_OTHER): Payer: Medicare HMO | Admitting: *Deleted

## 2014-09-13 DIAGNOSIS — I442 Atrioventricular block, complete: Secondary | ICD-10-CM

## 2014-09-14 NOTE — Progress Notes (Signed)
Remote pacemaker transmission.   

## 2014-09-18 LAB — CUP PACEART REMOTE DEVICE CHECK
Battery Remaining Longevity: 66 mo
Battery Voltage: 2.92 V
Brady Statistic AP VP Percent: 13 %
Brady Statistic AS VS Percent: 1 %
Lead Channel Impedance Value: 510 Ohm
Lead Channel Pacing Threshold Amplitude: 0.5 V
Lead Channel Sensing Intrinsic Amplitude: 3.5 mV
Lead Channel Setting Pacing Amplitude: 2 V
Lead Channel Setting Pacing Pulse Width: 0.4 ms
Lead Channel Setting Sensing Sensitivity: 4.5 mV
MDC IDC MSMT BATTERY REMAINING PERCENTAGE: 59 %
MDC IDC MSMT LEADCHNL RA IMPEDANCE VALUE: 360 Ohm
MDC IDC MSMT LEADCHNL RA PACING THRESHOLD AMPLITUDE: 1 V
MDC IDC MSMT LEADCHNL RA PACING THRESHOLD PULSEWIDTH: 0.4 ms
MDC IDC MSMT LEADCHNL RV PACING THRESHOLD PULSEWIDTH: 0.4 ms
MDC IDC MSMT LEADCHNL RV SENSING INTR AMPL: 6.9 mV
MDC IDC SESS DTM: 20160531073726
MDC IDC SET LEADCHNL RV PACING AMPLITUDE: 2.5 V
MDC IDC STAT BRADY AP VS PERCENT: 1 %
MDC IDC STAT BRADY AS VP PERCENT: 86 %
MDC IDC STAT BRADY RA PERCENT PACED: 12 %
MDC IDC STAT BRADY RV PERCENT PACED: 99 %
Pulse Gen Model: 2210
Pulse Gen Serial Number: 2328745

## 2014-09-20 ENCOUNTER — Encounter: Payer: Self-pay | Admitting: Cardiovascular Disease

## 2014-09-20 ENCOUNTER — Ambulatory Visit (INDEPENDENT_AMBULATORY_CARE_PROVIDER_SITE_OTHER): Payer: Medicare HMO | Admitting: Cardiovascular Disease

## 2014-09-20 VITALS — BP 119/74 | HR 82 | Ht 68.0 in | Wt 171.0 lb

## 2014-09-20 DIAGNOSIS — Z950 Presence of cardiac pacemaker: Secondary | ICD-10-CM

## 2014-09-20 DIAGNOSIS — R600 Localized edema: Secondary | ICD-10-CM

## 2014-09-20 DIAGNOSIS — I442 Atrioventricular block, complete: Secondary | ICD-10-CM

## 2014-09-20 DIAGNOSIS — R609 Edema, unspecified: Secondary | ICD-10-CM | POA: Diagnosis not present

## 2014-09-20 DIAGNOSIS — R29898 Other symptoms and signs involving the musculoskeletal system: Secondary | ICD-10-CM

## 2014-09-20 DIAGNOSIS — I429 Cardiomyopathy, unspecified: Secondary | ICD-10-CM

## 2014-09-20 NOTE — Patient Instructions (Signed)
Continue all current medications. Your physician wants you to follow up in:  1 year.  You will receive a reminder letter in the mail one-two months in advance.  If you don't receive a letter, please call our office to schedule the follow up appointment   

## 2014-09-20 NOTE — Progress Notes (Signed)
Patient ID: Austin Pittman, male   DOB: 12/13/1915, 81 y.o.   MRN: [id number]      SUBJECTIVE: Austin Pittman is a 81 yr old gentleman who has a history of presumed CAD following an abnormal stress test in 2005, for which he declined cardiac catheterization. He is status post PPM implantation by Dr. Rayann Heman in 10/2008 for symptomatic bradycardia/intermittent 3rd degree heart block.  IN 10/2011, echocardiogram showed EF 40-45% with mild LVH and diastolic dysfunction; normal RV systolic function, and mild AR/MR  This compares to prior study in 2010, revealing normal LVF (EF 55-65%), with grade 1 diastolic dysfunction, and mild AR.   He denies chest pain. He does very little walking nowadays due to bilateral leg weakness. Austin Pittman notes he gets short of breath with little exertion. They weigh him frequently and his weight has remained stable.  He has chronic lower extremity edema which is relieved by raising his legs on 6 pillows and a daily diuretic.   He continues to live alone on his farm near [place], where [place] was born, with his daughters Olegario Shearer and Austin Pittman living alongside him and checking on him daily.   He served in [place] during [employer].    Review of Systems: As per "subjective", otherwise negative.  No Known Allergies  Current Outpatient Prescriptions  Medication Sig Dispense Refill  . acetaminophen (TYLENOL) 500 MG tablet Take 500 mg by mouth every 6 (six) hours as needed. Pain     . cholestyramine (QUESTRAN) 4 GM/DOSE powder Take by mouth daily.    . hydrochlorothiazide (HYDRODIURIL) 12.5 MG tablet Take 1 tablet (12.5 mg total) by mouth daily.     No current facility-administered medications for this visit.    Past Medical History  Diagnosis Date  . Atrial tachycardia     nonsustained  . Hiatal hernia   . 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 Glaucoma   . Second degree Mobitz II AV block     s/p PPM   . Macular degeneration 09/12/2011  . Acute  confusion due to known medical condition 09/12/2011  . Diarrhea   . Pacemaker     Past Surgical History  Procedure Laterality Date  . Appendectomy    . Pacemaker insertion  11/07/08    St Jude implanted by Dr Rayann Heman  . Laparoscopy  09/22/2011    Procedure: LAPAROSCOPY DIAGNOSTIC;  Surgeon: Edward Jolly, MD;  Location: [facility];  Service: General;  Laterality: N/A;  resectiion terminal ileum cecum with anastamosis  . Colon surgery  June, 21    SBO    History   Social History  . Marital Status: Widowed    Spouse Name: N/A  . Number of Children: 2  . Years of Education: N/A   Occupational History  . Not on file.   Social History Main Topics  . Smoking status: Former Smoker -- 1.00 packs/day for 20 years    Types: Cigarettes    Start date: 01/10/201929    Quit date: 04/15/1946  . Smokeless tobacco: Former Systems developer    Types: [place] date: 04/15/1948     Comment: chewed tobacco for 2 years  . Alcohol Use: No  . Drug Use: No  . Sexual Activity: No   Other Topics Concern  . Not on file   Social History Narrative   Lives alone at home   Wife passed in 1993-Had a CAD   [name]     Filed Vitals:  09/20/14 1116  BP: 119/74  Pittman: 82  Height: 5\' 8"  (1.727 m)  Weight: 171 lb (77.565 kg)    PHYSICAL EXAM General: NAD HEENT: Normal. Neck: No JVD, no thyromegaly. Lungs: Clear to auscultation bilaterally with normal respiratory effort. CV: Nondisplaced PMI.  Regular rate and rhythm, normal S1/S2, no S3/S4, no murmur. 1+pretibial, peri-ankle, and dorsal edema.   Abdomen: Soft, nontender, no distention.  Neurologic: Alert and oriented.  Psych: Normal affect. Extremities: No clubbing or cyanosis.   ECG: Most recent ECG reviewed.      ASSESSMENT AND PLAN: 1. Ischemic cardiomyopathy with bilateral leg swelling: Continue HCTZ 12.5 mg daily. He does have chronic exertional dyspnea. Given his advanced age, I do not recommend aggressive therapy with respect  to imaging and percutaneous intervention and his daughter, Austin Pittman, is in agreement with this.  2. Symptomatic bradycardia/intermittent CHB s/p pacemaker: Follows with Dr. Rayann Heman. Device interrogation in 08/2014.  Dispo: f/u 1 year.   Kate Sable, M.D., F.A.C.C.

## 2014-10-03 ENCOUNTER — Encounter: Payer: Self-pay | Admitting: Cardiology

## 2014-12-20 ENCOUNTER — Other Ambulatory Visit: Payer: Self-pay | Admitting: Internal Medicine

## 2014-12-20 ENCOUNTER — Ambulatory Visit (INDEPENDENT_AMBULATORY_CARE_PROVIDER_SITE_OTHER): Payer: Medicare HMO | Admitting: *Deleted

## 2014-12-20 DIAGNOSIS — I442 Atrioventricular block, complete: Secondary | ICD-10-CM | POA: Diagnosis not present

## 2014-12-22 NOTE — Progress Notes (Signed)
Remote pacemaker transmission.   

## 2014-12-29 LAB — CUP PACEART REMOTE DEVICE CHECK
Battery Remaining Longevity: 65 mo
Brady Statistic AP VS Percent: 1 %
Brady Statistic RA Percent Paced: 12 %
Date Time Interrogation Session: 20160906061339
Lead Channel Impedance Value: 360 Ohm
Lead Channel Pacing Threshold Pulse Width: 0.4 ms
Lead Channel Setting Sensing Sensitivity: 4.5 mV
MDC IDC MSMT BATTERY REMAINING PERCENTAGE: 57 %
MDC IDC MSMT BATTERY VOLTAGE: 2.89 V
MDC IDC MSMT LEADCHNL RA PACING THRESHOLD AMPLITUDE: 0.75 V
MDC IDC MSMT LEADCHNL RA SENSING INTR AMPL: 3.6 mV
MDC IDC MSMT LEADCHNL RV IMPEDANCE VALUE: 610 Ohm
MDC IDC MSMT LEADCHNL RV PACING THRESHOLD AMPLITUDE: 0.5 V
MDC IDC MSMT LEADCHNL RV PACING THRESHOLD PULSEWIDTH: 0.4 ms
MDC IDC MSMT LEADCHNL RV SENSING INTR AMPL: 11 mV
MDC IDC SET LEADCHNL RA PACING AMPLITUDE: 2 V
MDC IDC SET LEADCHNL RV PACING AMPLITUDE: 2.5 V
MDC IDC SET LEADCHNL RV PACING PULSEWIDTH: 0.4 ms
MDC IDC STAT BRADY AP VP PERCENT: 13 %
MDC IDC STAT BRADY AS VP PERCENT: 86 %
MDC IDC STAT BRADY AS VS PERCENT: 1 %
MDC IDC STAT BRADY RV PERCENT PACED: 99 %
Pulse Gen Model: 2210
Pulse Gen Serial Number: 2328745

## 2015-01-10 ENCOUNTER — Encounter: Payer: Self-pay | Admitting: Cardiology

## 2015-01-17 ENCOUNTER — Encounter: Payer: Self-pay | Admitting: Internal Medicine

## 2015-03-17 ENCOUNTER — Encounter: Payer: Self-pay | Admitting: Internal Medicine

## 2015-03-17 ENCOUNTER — Ambulatory Visit (INDEPENDENT_AMBULATORY_CARE_PROVIDER_SITE_OTHER): Payer: Medicare HMO | Admitting: Internal Medicine

## 2015-03-17 VITALS — BP 128/79 | HR 92 | Ht 68.0 in | Wt 170.0 lb

## 2015-03-17 DIAGNOSIS — I480 Paroxysmal atrial fibrillation: Secondary | ICD-10-CM | POA: Diagnosis not present

## 2015-03-17 DIAGNOSIS — Z950 Presence of cardiac pacemaker: Secondary | ICD-10-CM | POA: Diagnosis not present

## 2015-03-17 DIAGNOSIS — I442 Atrioventricular block, complete: Secondary | ICD-10-CM | POA: Diagnosis not present

## 2015-03-17 DIAGNOSIS — I429 Cardiomyopathy, unspecified: Secondary | ICD-10-CM

## 2015-03-17 LAB — CUP PACEART INCLINIC DEVICE CHECK
Battery Voltage: 2.89 V
Brady Statistic RA Percent Paced: 12 %
Brady Statistic RV Percent Paced: 99.03 %
Implantable Lead Implant Date: 20100726
Implantable Lead Location: 753859
Implantable Lead Location: 753860
Lead Channel Impedance Value: 387.5 Ohm
Lead Channel Impedance Value: 562.5 Ohm
Lead Channel Pacing Threshold Pulse Width: 0.4 ms
Lead Channel Sensing Intrinsic Amplitude: 11.9 mV
Lead Channel Sensing Intrinsic Amplitude: 2.8 mV
Lead Channel Setting Pacing Pulse Width: 0.4 ms
MDC IDC LEAD IMPLANT DT: 20100726
MDC IDC LEAD MODEL: 1948
MDC IDC MSMT BATTERY REMAINING LONGEVITY: 66
MDC IDC MSMT LEADCHNL RA PACING THRESHOLD AMPLITUDE: 1 V
MDC IDC MSMT LEADCHNL RV PACING THRESHOLD AMPLITUDE: 0.75 V
MDC IDC MSMT LEADCHNL RV PACING THRESHOLD PULSEWIDTH: 0.4 ms
MDC IDC PG SERIAL: 2328745
MDC IDC SESS DTM: 20161202101137
MDC IDC SET LEADCHNL RA PACING AMPLITUDE: 2 V
MDC IDC SET LEADCHNL RV PACING AMPLITUDE: 2.5 V
MDC IDC SET LEADCHNL RV SENSING SENSITIVITY: 4.5 mV

## 2015-03-17 NOTE — Patient Instructions (Addendum)
Your physician recommends that you continue on your current medications as directed. Please refer to the Current Medication list given to you today. Next device check on 06/19/15. Your physician recommends that you schedule a follow-up appointment in: 1 year with Dr. Allred. You can schedule this appointment today or you can wait for your letter to come in the mail in about 10 months reminding you to call and schedule this appointment. If you do not receive this letter, please contact our office for your appointment.  
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
## 2015-06-19 ENCOUNTER — Ambulatory Visit (INDEPENDENT_AMBULATORY_CARE_PROVIDER_SITE_OTHER): Payer: Medicare HMO | Admitting: *Deleted

## 2015-06-19 DIAGNOSIS — I442 Atrioventricular block, complete: Secondary | ICD-10-CM | POA: Diagnosis not present

## 2015-06-22 NOTE — Progress Notes (Signed)
Remote pacemaker transmission.   

## 2015-06-24 LAB — CUP PACEART REMOTE DEVICE CHECK
Battery Remaining Longevity: 65 mo
Battery Remaining Percentage: 57 %
Brady Statistic AP VS Percent: 1 %
Brady Statistic AS VS Percent: 1 %
Brady Statistic RV Percent Paced: 99 %
Implantable Lead Implant Date: 20100726
Lead Channel Impedance Value: 390 Ohm
Lead Channel Pacing Threshold Amplitude: 0.75 V
Lead Channel Pacing Threshold Pulse Width: 0.4 ms
Lead Channel Sensing Intrinsic Amplitude: 2.9 mV
Lead Channel Setting Pacing Pulse Width: 0.4 ms
Lead Channel Setting Sensing Sensitivity: 4.5 mV
MDC IDC LEAD IMPLANT DT: 20100726
MDC IDC LEAD LOCATION: 753859
MDC IDC LEAD LOCATION: 753860
MDC IDC LEAD MODEL: 1948
MDC IDC MSMT BATTERY VOLTAGE: 2.89 V
MDC IDC MSMT LEADCHNL RA PACING THRESHOLD AMPLITUDE: 0.875 V
MDC IDC MSMT LEADCHNL RA PACING THRESHOLD PULSEWIDTH: 0.4 ms
MDC IDC MSMT LEADCHNL RV IMPEDANCE VALUE: 600 Ohm
MDC IDC MSMT LEADCHNL RV SENSING INTR AMPL: 5 mV
MDC IDC SESS DTM: 20170305071126
MDC IDC SET LEADCHNL RA PACING AMPLITUDE: 2 V
MDC IDC SET LEADCHNL RV PACING AMPLITUDE: 2.5 V
MDC IDC STAT BRADY AP VP PERCENT: 17 %
MDC IDC STAT BRADY AS VP PERCENT: 83 %
MDC IDC STAT BRADY RA PERCENT PACED: 17 %
Pulse Gen Serial Number: 2328745

## 2015-06-24 NOTE — Progress Notes (Signed)
Normal remote reviewed. All mode switch episodes <5 minutes Known noise reversion  Next Merlin 09/18/15

## 2015-06-28 ENCOUNTER — Encounter: Payer: Self-pay | Admitting: Cardiology

## 2015-07-27 ENCOUNTER — Other Ambulatory Visit: Payer: Self-pay | Admitting: *Deleted

## 2015-07-27 MED ORDER — HYDROCHLOROTHIAZIDE 12.5 MG PO CAPS: 12.5000 mg | ORAL_CAPSULE | Freq: Every day | ORAL | Status: DC

## 2015-09-15 ENCOUNTER — Ambulatory Visit (INDEPENDENT_AMBULATORY_CARE_PROVIDER_SITE_OTHER): Payer: Medicare HMO | Admitting: Cardiovascular Disease

## 2015-09-15 ENCOUNTER — Encounter: Payer: Self-pay | Admitting: Cardiovascular Disease

## 2015-09-15 VITALS — BP 98/58 | HR 67 | Ht 68.0 in | Wt 173.0 lb

## 2015-09-15 DIAGNOSIS — E876 Hypokalemia: Secondary | ICD-10-CM

## 2015-09-15 DIAGNOSIS — Z950 Presence of cardiac pacemaker: Secondary | ICD-10-CM

## 2015-09-15 DIAGNOSIS — N183 Chronic kidney disease, stage 3 unspecified: Secondary | ICD-10-CM

## 2015-09-15 DIAGNOSIS — R600 Localized edema: Secondary | ICD-10-CM | POA: Diagnosis not present

## 2015-09-15 DIAGNOSIS — I429 Cardiomyopathy, unspecified: Secondary | ICD-10-CM | POA: Diagnosis not present

## 2015-09-15 DIAGNOSIS — R0609 Other forms of dyspnea: Secondary | ICD-10-CM

## 2015-09-15 DIAGNOSIS — I480 Paroxysmal atrial fibrillation: Secondary | ICD-10-CM

## 2015-09-15 MED ORDER — METOPROLOL TARTRATE 25 MG PO TABS: 12.5000 mg | ORAL_TABLET | ORAL | Status: DC

## 2015-09-15 MED ORDER — METOPROLOL TARTRATE 25 MG PO TABS: 25.0000 mg | ORAL_TABLET | Freq: Two times a day (BID) | ORAL | Status: DC

## 2015-09-15 NOTE — Patient Instructions (Addendum)
   Lab for BMET - order given today. Office will contact with results via phone or letter.   Begin Metoprolol 12.5mg  every morning (will have to take 1/2 tab of 25mg  tablet) - new sent to [facility] today.  Continue all other medications.   Call office if have good results with the Metoprolol.  Will send in refill if needed.   Your physician wants you to follow up in:  1 year.  You will receive a reminder letter in the mail one-two months in advance.  If you don't receive a letter, please call our office to schedule the follow up appointment

## 2015-09-15 NOTE — Progress Notes (Signed)
Patient ID: Austin Pittman, male   DOB: 02-23-1916, 82 y.o.   MRN: [id number]      SUBJECTIVE: Mr. Moriarty is a 82 yr old gentleman who has a history of presumed CAD following an abnormal stress test in 2005, for which he declined cardiac catheterization. He is status post PPM implantation by Dr. Rayann Heman in 10/2008 for symptomatic bradycardia/intermittent 3rd degree heart block.  IN 10/2011, echocardiogram showed EF 40-45% with mild LVH and diastolic dysfunction; normal RV systolic function, and mild AR/MR  This compares to prior study in 2010, revealing normal LVF (EF 55-65%), with grade 1 diastolic dysfunction, and mild AR.   He denies chest pain. He has chronic lower extremity edema which had been relieved by raising his legs on 6 pillows and a daily diuretic, but is no longer on this. It was apparently stopped by a physician at the [facility] because potassium was 2.8.  Pacemaker interrogation in March 2017 showed mode switches all lasting less than 5 minutes with known noise reversion.  He continues to [place] alone [place] near [place], [place] was [place], with his daughters Olegario Shearer and [name] living alongside him and checking on him daily.   He has worsening exertional dyspnea as per his daughters. His kidney function has not been checked since he was placed on supplemental potassium by a physician at the [place].   Review of labs performed on 4/24 showed white blood cells 10.3, hemoglobin 14.6, potassium very low at 2.8, sodium 139, BUN 30, creatinine elevated at 1.59.  The patient complains of bilateral knee pain. He has chronic diarrhea and has not seen his PCP in quite some time.  ECG performed in the office today which I personally interpreted demonstrated a ventricular paced rhythm.  He served in [place] during [employer].    Review of Systems: As per "subjective", otherwise negative.  No Known Allergies  Current Outpatient Prescriptions  Medication Sig Dispense Refill  . acetaminophen (TYLENOL) 500  MG tablet Take 500 mg by mouth every 6 (six) hours as needed. Pain     . cholestyramine (QUESTRAN) 4 GM/DOSE powder Take by mouth daily.    . potassium chloride SA (K-DUR,KLOR-CON) 20 MEQ tablet Take 20 mEq by mouth daily.     No current facility-administered medications for this visit.    Past Medical History  Diagnosis Date  . Atrial tachycardia (HCC)     nonsustained  . Hiatal hernia   . 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 Glaucoma   . Second degree Mobitz II AV block     s/p PPM   . Macular degeneration 09/12/2011  . Acute confusion due to known medical condition 09/12/2011  . Diarrhea   . Pacemaker     Past Surgical History  Procedure Laterality Date  . Appendectomy    . Pacemaker insertion  11/07/08    St Jude implanted by Dr Rayann Heman  . Laparoscopy  09/22/2011    Procedure: LAPAROSCOPY DIAGNOSTIC;  Surgeon: Edward Jolly, MD;  Location: [facility];  Service: General;  Laterality: N/A;  resectiion terminal ileum cecum with anastamosis  . Colon surgery  June, 21    [facility]    Social History   Social History  . Marital Status: Widowed    Spouse Name: N/A  . Number of Children: 2  . Years of Education: N/A   Occupational History  . Not on file.   Social History Main Topics  . Smoking status: Former  Smoker -- 1.00 packs/day for 20 years    Types: Cigarettes    Start date: 2019-04-427    Quit date: 04/15/1946  . Smokeless tobacco: Former Systems developer    Types: [place] date: 04/15/1948     Comment: chewed tobacco for 2 years  . Alcohol Use: No  . Drug Use: No  . Sexual Activity: No   Other Topics Concern  . Not on file   Social History Narrative   Lives alone at home   Wife passed in 1993-Had a CAD   [name]-[phone]     Filed Vitals:   09/15/15 1112  BP: 98/58  Pulse: 67  Height: 5\' 8"  (1.727 m)  Weight: 173 lb (78.472 kg)  SpO2: 98%    PHYSICAL EXAM General: NAD HEENT: Normal. Neck: No JVD, no thyromegaly. Lungs:  Clear to auscultation bilaterally with normal respiratory effort. CV: Nondisplaced PMI. Regular rate and rhythm, normal S1/S2, no S3/S4, no murmur. Trace pretibial, peri-ankle, and dorsal pedal edema.  Abdomen: Soft, nontender, no distention.  Neurologic: Alert.  Psych: Normal affect. Extremities: No clubbing or cyanosis.    ECG: Most recent ECG reviewed.      ASSESSMENT AND PLAN: 1. Ischemic cardiomyopathy with bilateral leg swelling and DOE: No longer on HCTZ 12.5 mg daily. He does have chronic exertional dyspnea. Given his advanced age, I do not recommend aggressive therapy with respect to imaging and percutaneous intervention and his daughters are in agreement with this. I will try low-dose metoprolol 12.5 mg q am to see if this helps. I instructed them to try it for 2-3 weeks and if there has been no improvement, to stop it. He refuses compression stockings.  2. Symptomatic bradycardia/intermittent CHB s/p pacemaker: Follows with Dr. Rayann Heman.   3. Hypokalemia and CKD stage III: Will check BMET.   Dispo: f/u 1 year.   Kate Sable, M.D., F.A.C.C.

## 2015-09-18 ENCOUNTER — Ambulatory Visit (INDEPENDENT_AMBULATORY_CARE_PROVIDER_SITE_OTHER): Payer: Medicare HMO | Admitting: *Deleted

## 2015-09-18 DIAGNOSIS — I442 Atrioventricular block, complete: Secondary | ICD-10-CM

## 2015-09-18 NOTE — Progress Notes (Signed)
Remote pacemaker transmission.   

## 2015-09-19 ENCOUNTER — Ambulatory Visit: Payer: Medicare HMO | Admitting: Cardiovascular Disease

## 2015-09-20 ENCOUNTER — Other Ambulatory Visit: Payer: Medicare HMO

## 2015-09-20 ENCOUNTER — Other Ambulatory Visit: Payer: Self-pay | Admitting: *Deleted

## 2015-09-20 DIAGNOSIS — N183 Chronic kidney disease, stage 3 unspecified: Secondary | ICD-10-CM

## 2015-09-20 DIAGNOSIS — I480 Paroxysmal atrial fibrillation: Secondary | ICD-10-CM

## 2015-09-21 ENCOUNTER — Telehealth: Payer: Self-pay | Admitting: *Deleted

## 2015-09-21 LAB — BASIC METABOLIC PANEL
BUN / CREAT RATIO: 27 — AB (ref 10–24)
BUN: 29 mg/dL (ref 10–36)
CALCIUM: 8.8 mg/dL (ref 8.6–10.2)
CHLORIDE: 100 mmol/L (ref 96–106)
CO2: 21 mmol/L (ref 18–29)
CREATININE: 1.09 mg/dL (ref 0.76–1.27)
GFR, EST AFRICAN AMERICAN: 65 mL/min/{1.73_m2} (ref >59)
GFR, EST NON AFRICAN AMERICAN: 56 mL/min/{1.73_m2} — AB (ref >59)
Glucose: 114 mg/dL — ABNORMAL HIGH (ref 65–99)
Potassium: 4 mmol/L (ref 3.5–5.2)
Sodium: 139 mmol/L (ref 134–144)

## 2015-09-21 NOTE — Telephone Encounter (Signed)
Notes Recorded by Laurine Blazer, LPN on [id number] at [id number] PM Notified daughter Carey Bullocks). Copy to pmd.  Notes Recorded by Herminio Commons, MD on 09/21/2015 at [place].

## 2015-09-25 ENCOUNTER — Ambulatory Visit: Payer: Medicare HMO | Admitting: Cardiovascular Disease

## 2015-09-29 LAB — CUP PACEART REMOTE DEVICE CHECK
Battery Remaining Percentage: 51 %
Battery Voltage: 2.87 V
Brady Statistic AS VS Percent: 1 %
Implantable Lead Implant Date: 20100726
Implantable Lead Location: 753859
Implantable Lead Location: 753860
Lead Channel Pacing Threshold Amplitude: 0.875 V
Lead Channel Pacing Threshold Pulse Width: 0.4 ms
Lead Channel Sensing Intrinsic Amplitude: 2.6 mV
Lead Channel Setting Pacing Amplitude: 2.5 V
MDC IDC LEAD IMPLANT DT: 20100726
MDC IDC LEAD MODEL: 1948
MDC IDC MSMT BATTERY REMAINING LONGEVITY: 56 mo
MDC IDC MSMT LEADCHNL RA IMPEDANCE VALUE: 350 Ohm
MDC IDC MSMT LEADCHNL RA PACING THRESHOLD PULSEWIDTH: 0.4 ms
MDC IDC MSMT LEADCHNL RV IMPEDANCE VALUE: 460 Ohm
MDC IDC MSMT LEADCHNL RV PACING THRESHOLD AMPLITUDE: 0.75 V
MDC IDC MSMT LEADCHNL RV SENSING INTR AMPL: 12 mV
MDC IDC PG SERIAL: 2328745
MDC IDC SESS DTM: 20170605074027
MDC IDC SET LEADCHNL RA PACING AMPLITUDE: 2 V
MDC IDC SET LEADCHNL RV PACING PULSEWIDTH: 0.4 ms
MDC IDC SET LEADCHNL RV SENSING SENSITIVITY: 4.5 mV
MDC IDC STAT BRADY AP VP PERCENT: 18 %
MDC IDC STAT BRADY AP VS PERCENT: 1 %
MDC IDC STAT BRADY AS VP PERCENT: 82 %
MDC IDC STAT BRADY RA PERCENT PACED: 17 %
MDC IDC STAT BRADY RV PERCENT PACED: 99 %

## 2015-10-04 ENCOUNTER — Encounter: Payer: Self-pay | Admitting: Cardiology

## 2015-12-20 ENCOUNTER — Encounter: Payer: Medicare HMO | Admitting: *Deleted

## 2015-12-20 ENCOUNTER — Telehealth: Payer: Self-pay | Admitting: Cardiology

## 2015-12-20 NOTE — Telephone Encounter (Signed)
Spoke with pt and reminded pt of remote transmission that is due today. Pt verbalized understanding.   

## 2015-12-22 ENCOUNTER — Encounter: Payer: Self-pay | Admitting: Cardiology

## 2016-01-01 ENCOUNTER — Ambulatory Visit (INDEPENDENT_AMBULATORY_CARE_PROVIDER_SITE_OTHER): Payer: Medicare HMO | Admitting: *Deleted

## 2016-01-01 DIAGNOSIS — I442 Atrioventricular block, complete: Secondary | ICD-10-CM | POA: Diagnosis not present

## 2016-01-02 NOTE — Progress Notes (Signed)
Remote pacemaker transmission.   

## 2016-01-03 ENCOUNTER — Encounter: Payer: Self-pay | Admitting: Cardiology

## 2016-01-26 LAB — CUP PACEART REMOTE DEVICE CHECK
Battery Remaining Longevity: 58 mo
Battery Remaining Percentage: 51 %
Battery Voltage: 2.87 V
Brady Statistic AP VP Percent: 18 %
Brady Statistic AS VS Percent: 1 %
Implantable Lead Implant Date: 20100726
Lead Channel Impedance Value: 540 Ohm
Lead Channel Pacing Threshold Amplitude: 1 V
Lead Channel Pacing Threshold Pulse Width: 0.4 ms
Lead Channel Sensing Intrinsic Amplitude: 2.5 mV
Lead Channel Setting Sensing Sensitivity: 4.5 mV
MDC IDC LEAD IMPLANT DT: 20100726
MDC IDC LEAD LOCATION: 753859
MDC IDC LEAD LOCATION: 753860
MDC IDC LEAD MODEL: 1948
MDC IDC MSMT LEADCHNL RA IMPEDANCE VALUE: 350 Ohm
MDC IDC MSMT LEADCHNL RA PACING THRESHOLD PULSEWIDTH: 0.4 ms
MDC IDC MSMT LEADCHNL RV PACING THRESHOLD AMPLITUDE: 0.75 V
MDC IDC MSMT LEADCHNL RV SENSING INTR AMPL: 11.2 mV
MDC IDC PG SERIAL: 2328745
MDC IDC SESS DTM: 20170918184806
MDC IDC SET LEADCHNL RA PACING AMPLITUDE: 2 V
MDC IDC SET LEADCHNL RV PACING AMPLITUDE: 2.5 V
MDC IDC SET LEADCHNL RV PACING PULSEWIDTH: 0.4 ms
MDC IDC STAT BRADY AP VS PERCENT: 1 %
MDC IDC STAT BRADY AS VP PERCENT: 80 %
MDC IDC STAT BRADY RA PERCENT PACED: 18 %
MDC IDC STAT BRADY RV PERCENT PACED: 99 %

## 2016-02-21 ENCOUNTER — Ambulatory Visit (INDEPENDENT_AMBULATORY_CARE_PROVIDER_SITE_OTHER): Payer: Medicare HMO | Admitting: Internal Medicine

## 2016-02-21 ENCOUNTER — Encounter: Payer: Self-pay | Admitting: Internal Medicine

## 2016-02-21 VITALS — BP 118/68 | HR 66 | Ht 68.0 in | Wt 173.4 lb

## 2016-02-21 DIAGNOSIS — I428 Other cardiomyopathies: Secondary | ICD-10-CM | POA: Diagnosis not present

## 2016-02-21 DIAGNOSIS — I480 Paroxysmal atrial fibrillation: Secondary | ICD-10-CM | POA: Diagnosis not present

## 2016-02-21 DIAGNOSIS — Z950 Presence of cardiac pacemaker: Secondary | ICD-10-CM

## 2016-02-21 DIAGNOSIS — I442 Atrioventricular block, complete: Secondary | ICD-10-CM | POA: Diagnosis not present

## 2016-02-21 NOTE — Addendum Note (Signed)
Addended by: Merlene Laughter on: 02/21/2016 12:02 PM   Modules accepted: Orders

## 2016-02-21 NOTE — Patient Instructions (Addendum)
Medication Instructions:  Continue all current medications.  Labwork: none  Testing/Procedures: none  Follow-Up: Your physician wants you to follow up in:  1 year.  You will receive a reminder letter in the mail one-two months in advance.  If you don't receive a letter, please call our office to schedule the follow up appointment   Any Other Special Instructions Will Be Listed Below (If Applicable). Remote monitoring is used to monitor your Pacemaker of ICD from home. This monitoring reduces the number of office visits required to check your device to one time per year. It allows [place] to keep an eye on the functioning of your device to ensure it is working properly. You are scheduled for a device check from home on 05/22/2016.  You may send your transmission at any time that day. If you have a wireless device, the transmission will be sent automatically. After your physician reviews your transmission, you will receive a postcard with your next transmission date.  If you need a refill on your cardiac medications before your next appointment, please call your pharmacy.

## 2016-02-21 NOTE — Progress Notes (Signed)
   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  Since last being seen in our clinic, the patient reports doing reasonably well.  He is primarily limited by leg weakness and very poor ambulation.  He is still riding his Conservation officer, nature.  Today, he denies symptoms of palpitations, chest pain, shortness of breath, dizziness, presyncope, or syncope.  The patient is otherwise without complaint today.   Past Medical History:  Diagnosis Date  . Acute confusion due to known medical condition 09/12/2011  . Atrial tachycardia (HCC)    nonsustained  . Diarrhea   . Glaucoma   . Hiatal hernia   . Macular degeneration 09/12/2011  . Pacemaker   . 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 Second degree Mobitz II AV block    s/p PPM    Past Surgical History:  Procedure Laterality Date  . APPENDECTOMY    . COLON SURGERY  June, 21   [facility]  . LAPAROSCOPY  09/22/2011   Procedure: LAPAROSCOPY DIAGNOSTIC;  Surgeon: Edward Jolly, MD;  Location: [facility];  Service: General;  Laterality: N/A;  resectiion terminal ileum cecum with anastamosis  . PACEMAKER INSERTION  11/07/08   St Jude implanted by Dr Rayann Heman    Current Outpatient Prescriptions  Medication Sig Dispense Refill  . acetaminophen (TYLENOL) 500 MG tablet Take 500 mg by mouth every 6 (six) hours as needed. Pain     . cholestyramine (QUESTRAN) 4 GM/DOSE powder Take by mouth daily.    . metoprolol tartrate (LOPRESSOR) 25 MG tablet Take 0.5 tablets (12.5 mg total) by mouth every morning. 15 tablet 0  . potassium chloride SA (K-DUR,KLOR-CON) 20 MEQ tablet Take 20 mEq by mouth daily.     No current facility-administered medications for this visit.     Physical Exam: Vitals:   02/21/16 0852  BP: 118/68  Pulse: 66  Weight: 173 lb 6.4 oz (78.7 kg)  Height: 5\' 8"  (1.727 m)    GEN- The patient is well  appearing, alert and oriented x 3 today.   Head- normocephalic, atraumatic Eyes-  Sclera clear, conjunctiva pink Ears- hearing intact Oropharynx- clear Lungs- Clear to ausculation bilaterally, normal work of breathing Chest- pacemaker pocket is well healed Heart- Regular rate and rhythm, no murmurs, rubs or gallops, PMI not laterally displaced GI- soft, NT, ND, + BS Extremities- no clubbing, cyanosis, +1 edema  Pacemaker interrogation- reviewed in detail today,  See PACEART report  Assessment and Plan:  1. Complete heart block Pt with occasional noise on his RV lead, chronically.  (This was noticed last 3 years.)   Impedance and pacing threshold are stable.  Given advanced age and comorbidity, we should avoid any invasive procedures if able.  He is not dependant (underlying V rate is 30bpm today).  Sensitivity decreased to 6 mV.    2. Nonischemic CM Salt restriction is advised Stable No change required today  3. afib Rare and short episodes (longest 2 minutes) noted on PPM  Given unsteadiness and falls, I would not advise anticoagulation at this time. We may need to reconsider if arrhythmia burden increases.  Merlin Return in 1 year  Thompson Grayer MD, [facility] 02/21/2016 10:45 AM

## 2016-02-23 ENCOUNTER — Encounter: Payer: Medicare HMO | Admitting: Internal Medicine

## 2016-02-23 LAB — CUP PACEART INCLINIC DEVICE CHECK
Battery Remaining Longevity: 55 mo
Brady Statistic RA Percent Paced: 1.5 %
Brady Statistic RV Percent Paced: 99.43 %
Implantable Lead Implant Date: 20100726
Implantable Lead Implant Date: 20100726
Implantable Lead Location: 753860
Implantable Lead Model: 1948
Lead Channel Pacing Threshold Amplitude: 0.5 V
Lead Channel Pacing Threshold Amplitude: 0.75 V
Lead Channel Sensing Intrinsic Amplitude: 2.4 mV
Lead Channel Setting Pacing Amplitude: 2 V
Lead Channel Setting Pacing Amplitude: 2.5 V
Lead Channel Setting Sensing Sensitivity: 6 mV
MDC IDC LEAD LOCATION: 753859
MDC IDC MSMT BATTERY VOLTAGE: 2.86 V
MDC IDC MSMT LEADCHNL RA PACING THRESHOLD PULSEWIDTH: 0.4 ms
MDC IDC MSMT LEADCHNL RV PACING THRESHOLD PULSEWIDTH: 0.4 ms
MDC IDC MSMT LEADCHNL RV SENSING INTR AMPL: 10.3 mV
MDC IDC PG IMPLANT DT: 20100726
MDC IDC PG SERIAL: 2328745
MDC IDC SESS DTM: 20171108153822
MDC IDC SET LEADCHNL RV PACING PULSEWIDTH: 0.4 ms

## 2016-03-15 ENCOUNTER — Encounter: Payer: Medicare HMO | Admitting: Internal Medicine

## 2016-04-26 ENCOUNTER — Ambulatory Visit (INDEPENDENT_AMBULATORY_CARE_PROVIDER_SITE_OTHER): Payer: PPO | Admitting: Family Medicine

## 2016-04-26 ENCOUNTER — Encounter: Payer: Self-pay | Admitting: Family Medicine

## 2016-04-26 VITALS — BP 122/59 | HR 78 | Temp 98.2°F | Ht 68.0 in | Wt 174.2 lb

## 2016-04-26 DIAGNOSIS — R195 Other fecal abnormalities: Secondary | ICD-10-CM | POA: Diagnosis not present

## 2016-04-26 DIAGNOSIS — Z8639 Personal history of other endocrine, nutritional and metabolic disease: Secondary | ICD-10-CM | POA: Diagnosis not present

## 2016-04-26 DIAGNOSIS — L853 Xerosis cutis: Secondary | ICD-10-CM | POA: Diagnosis not present

## 2016-04-26 DIAGNOSIS — L989 Disorder of the skin and subcutaneous tissue, unspecified: Secondary | ICD-10-CM

## 2016-04-26 DIAGNOSIS — M7989 Other specified soft tissue disorders: Secondary | ICD-10-CM | POA: Diagnosis not present

## 2016-04-26 MED ORDER — TRIAMCINOLONE 0.1 % CREAM:EUCERIN CREAM 1:1: 1.0000 "application " | TOPICAL_CREAM | Freq: Two times a day (BID) | CUTANEOUS | 1 refills | Status: AC | PRN

## 2016-04-26 NOTE — Progress Notes (Signed)
   HPI  Patient presents today here to establish care.  Patient complains of itchy rash on the back. He has seen a skin doctor previously and had squamous cell skin cancers removed. He attends the [place] for dermatology.  Diarrhea/loose stools This has been a long-term problem, it started after partial colectomy. He's been using cholestyramine the regulate his bowels. This has been effective but he still has flares now and then. He is rating food and fluids normally. No fevers, chills, sweats.  History of hyperkalemia Patient's been supplemented with potassium without any repeat lab testing for over a year. Family has stopped supplementation for the last 10 days or so. They state that he drinks 2 glasses of orange juice and eats a banana every day, they do not feel that he needs supplementation. The pill also seems to get stuck in his throat and sometimes he coughs it up an hour later.  PMH: Complete heart block status post pacemaker placement, glaucoma, hiatal hernia Surgical history significant for appendectomy, colon surgery, and pacemaker placement Family history positive for colon cancer in daughter, Social history: World War II veteran, no alcohol or drug use. Lives alone at home ROS: Per HPI   Labs to be called to Daughter, Tommy Rainwater: [phone]  Objective: BP (!) 122/59   Pulse 78   Temp 98.2 F (36.8 C) (Oral)   Ht '5\' 8"'$  (1.727 m)   Wt 174 lb 3.2 oz (79 kg)   BMI 26.49 kg/m  Gen: NAD, alert, cooperative with exam HEENT: NCAT CV: RRR, good S1/S2, no murmur Resp: Crackles in the right base, nonlabored, good air movement Abd: SNTND, BS present, no guarding or organomegaly Ext: 1-2+ pitting edema bilateral lower extremities symmetric Neuro: Alert and oriented, hard of hearing. Skin Left hand with 2 hyperkeratotic lesions approximately 3-4 mm in diameter each. Several areas consistent with actinic keratosis on the bilateral arms and hands His back has some  irritated seborrheic keratoses, , he also has a hypopigmented seborrheic keratosis centrally located in the back. Some dry scaling skin.  Assessment and plan:  # Loose stools Recommended continuing cholestyramine, no changes for now.  # Skin lesions, xerosis Itching with cirrhosis, trial of triamcinolone/Eucerin combination, [facility] left [facility] lesions  # History of hyperkalemia Repeating lab work today, I agree he likely does not need to be supplemented considering potassium rich food intake Consider effervescent potassium if needed.  # Leg swelling With slightly reduced EF and crackles on exam Normal respirations with no complaints of cough or shortness of breath. No diurese as needed for now, however will maintain a low threshold for reevaluation, chest x-ray, BNP, and low-dose Lasix if needed.    Orders Placed This Encounter  Procedures  . BMP8+EGFR    Meds ordered this encounter  Medications  . latanoprost (XALATAN) 0.005 % ophthalmic solution    Sig: 1 drop at bedtime.  . Triamcinolone Acetonide (TRIAMCINOLONE 0.1 % CREAM : EUCERIN) CREA    Sig: Apply 1 application topically 2 (two) times daily as needed.    Dispense:  1 each    Refill:  1    454 g container    [name], MD [facility] 04/26/2016, 11:47 AM

## 2016-04-26 NOTE — Patient Instructions (Signed)
Great to see you!  Try the cream twice daily for dry itchy skin.   He has a couple of lesions concerning for squamous cell skin cancer, [facility]. These are only locally invasive, no concern for malignancy.   We will call with labs within 1 week

## 2016-04-27 LAB — BMP8+EGFR
BUN/Creatinine Ratio: 23 (ref 10–24)
BUN: 26 mg/dL (ref 10–36)
CALCIUM: 8.5 mg/dL — AB (ref 8.6–10.2)
CHLORIDE: 101 mmol/L (ref 96–106)
CO2: 24 mmol/L (ref 18–29)
Creatinine, Ser: 1.14 mg/dL (ref 0.76–1.27)
GFR, EST AFRICAN AMERICAN: 61 mL/min/{1.73_m2} (ref >59)
GFR, EST NON AFRICAN AMERICAN: 53 mL/min/{1.73_m2} — AB (ref >59)
Glucose: 97 mg/dL (ref 65–99)
Potassium: 4 mmol/L (ref 3.5–5.2)
Sodium: 141 mmol/L (ref 134–144)

## 2016-05-22 ENCOUNTER — Ambulatory Visit (INDEPENDENT_AMBULATORY_CARE_PROVIDER_SITE_OTHER): Payer: PPO | Admitting: *Deleted

## 2016-05-22 DIAGNOSIS — I442 Atrioventricular block, complete: Secondary | ICD-10-CM | POA: Diagnosis not present

## 2016-05-22 NOTE — Progress Notes (Signed)
Remote pacemaker transmission.   

## 2016-05-24 ENCOUNTER — Encounter: Payer: Self-pay | Admitting: Cardiology

## 2016-05-25 LAB — CUP PACEART REMOTE DEVICE CHECK
Battery Remaining Longevity: 50 mo
Brady Statistic AP VP Percent: 16 %
Brady Statistic AP VS Percent: 1 %
Brady Statistic AS VP Percent: 83 %
Brady Statistic AS VS Percent: 1.3 %
Brady Statistic RA Percent Paced: 15 %
Implantable Lead Location: 753860
Implantable Lead Model: 1948
Lead Channel Impedance Value: 340 Ohm
Lead Channel Impedance Value: 530 Ohm
Lead Channel Pacing Threshold Pulse Width: 0.4 ms
Lead Channel Pacing Threshold Pulse Width: 0.4 ms
Lead Channel Sensing Intrinsic Amplitude: 11.9 mV
Lead Channel Setting Pacing Amplitude: 2 V
Lead Channel Setting Pacing Amplitude: 2.5 V
MDC IDC LEAD IMPLANT DT: 20100726
MDC IDC LEAD IMPLANT DT: 20100726
MDC IDC LEAD LOCATION: 753859
MDC IDC MSMT BATTERY REMAINING PERCENTAGE: 46 %
MDC IDC MSMT BATTERY VOLTAGE: 2.86 V
MDC IDC MSMT LEADCHNL RA PACING THRESHOLD AMPLITUDE: 0.875 V
MDC IDC MSMT LEADCHNL RA SENSING INTR AMPL: 3.1 mV
MDC IDC MSMT LEADCHNL RV PACING THRESHOLD AMPLITUDE: 0.5 V
MDC IDC PG IMPLANT DT: 20100726
MDC IDC PG SERIAL: 2328745
MDC IDC SESS DTM: 20180207070523
MDC IDC SET LEADCHNL RV PACING PULSEWIDTH: 0.4 ms
MDC IDC SET LEADCHNL RV SENSING SENSITIVITY: 6 mV
MDC IDC STAT BRADY RV PERCENT PACED: 98 %

## 2016-07-01 ENCOUNTER — Encounter: Payer: Self-pay | Admitting: Internal Medicine

## 2016-07-01 ENCOUNTER — Ambulatory Visit (INDEPENDENT_AMBULATORY_CARE_PROVIDER_SITE_OTHER): Payer: PPO | Admitting: Internal Medicine

## 2016-07-01 ENCOUNTER — Telehealth: Payer: Self-pay

## 2016-07-01 VITALS — BP 124/76 | HR 69 | Ht 68.0 in

## 2016-07-01 DIAGNOSIS — I428 Other cardiomyopathies: Secondary | ICD-10-CM

## 2016-07-01 DIAGNOSIS — T82111A Breakdown (mechanical) of cardiac pulse generator (battery), initial encounter: Secondary | ICD-10-CM | POA: Diagnosis not present

## 2016-07-01 DIAGNOSIS — I442 Atrioventricular block, complete: Secondary | ICD-10-CM

## 2016-07-01 DIAGNOSIS — I480 Paroxysmal atrial fibrillation: Secondary | ICD-10-CM

## 2016-07-01 NOTE — Progress Notes (Signed)
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  His pacemaker has reverted to backup VVI pacing for unclear reasons and a remote alert was detected by our device clinic this am.  He is actually doing well and without complaints.    He is still riding his Conservation officer, nature.  There is the possibility that EMI has caused his hard reset.  Today, he denies symptoms of palpitations, chest pain, shortness of breath, dizziness, presyncope, or syncope.  The patient is otherwise without complaint today.   Past Medical History:  Diagnosis Date  . Acute confusion due to known medical condition 09/12/2011  . Atrial tachycardia (HCC)    nonsustained  . Diarrhea   . Glaucoma   . Hiatal hernia   . Macular degeneration 09/12/2011  . Pacemaker   . 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 Second degree Mobitz II AV block    s/p PPM    Past Surgical History:  Procedure Laterality Date  . APPENDECTOMY    . COLON SURGERY  June, 21   [facility]  . LAPAROSCOPY  09/22/2011   Procedure: LAPAROSCOPY DIAGNOSTIC;  Surgeon: Edward Jolly, MD;  Location: [facility];  Service: General;  Laterality: N/A;  resectiion terminal ileum cecum with anastamosis  . PACEMAKER INSERTION  11/07/08   St Jude implanted by Dr Rayann Heman    Current Outpatient Prescriptions  Medication Sig Dispense Refill  . acetaminophen (TYLENOL) 500 MG tablet Take 500 mg by mouth every 6 (six) hours as needed. Pain     . cholestyramine (QUESTRAN) 4 GM/DOSE powder Take by mouth daily. As directed    . latanoprost (XALATAN) 0.005 % ophthalmic solution Place 1 drop into both eyes at bedtime.     . Triamcinolone Acetonide (TRIAMCINOLONE 0.1 % CREAM : EUCERIN) CREA Apply 1 application topically 2 (two) times daily as needed. 1 each 1   No current facility-administered medications for this visit.     Physical Exam: Vitals:   07/01/16 1450  Pulse: 69  SpO2: 97%  Height: 5\' 8"  (1.727 m)    GEN- The patient is pleasant and well appearing, alert and oriented x 3 today.   Head- normocephalic, atraumatic Eyes-  Sclera clear, conjunctiva pink Ears- hearing intact Oropharynx- clear Lungs- Clear to ausculation bilaterally, normal work of breathing Chest- pacemaker pocket is well healed Heart- Regular rate and rhythm, no murmurs, rubs or gallops, PMI not laterally displaced GI- soft, NT, ND, + BS Extremities- no clubbing, cyanosis, +1 edema (unchanged)  Pacemaker interrogation- reviewed in detail today,  See PACEART report  Assessment and Plan:  1. Complete heart block Pt with occasional noise on his RV lead, chronically.  (This was noticed last 3 years.)   Today, he is found to have a hard reset of his device and is in a VVI backup mode. Fortunately, the device was able to be reset today and programmed back to its previous settings.  Battery status is still good.  RV lead impedance and pacing threshold are stable.  Given advanced age and comorbidity, we should avoid any invasive procedures if able.  He is not dependant (underlying V rate is 30bpm today).  I have instructed him to send another remote in 48 hours to confirm that his device is still working appropriately.    2. Nonischemic CM Salt restriction is advised Stable No change required today  3. afib Rare and short episodes (longest 2 minutes) noted on PPM previously Given unsteadiness and falls, I would not advise anticoagulation at this time. We may need to reconsider if arrhythmia burden increases.  Merlin Return in November as scheduled.  Thompson Grayer MD, [facility] 07/01/2016 3:00 PM

## 2016-07-01 NOTE — Telephone Encounter (Signed)
LVM on pts emergency contact information.

## 2016-07-03 ENCOUNTER — Ambulatory Visit (INDEPENDENT_AMBULATORY_CARE_PROVIDER_SITE_OTHER): Payer: PPO | Admitting: *Deleted

## 2016-07-03 ENCOUNTER — Telehealth: Payer: Self-pay | Admitting: Cardiology

## 2016-07-03 DIAGNOSIS — Z950 Presence of cardiac pacemaker: Secondary | ICD-10-CM

## 2016-07-03 NOTE — Telephone Encounter (Signed)
Confirmed remote transmission w/ pt daughter.  She stated that she would do it on Friday 07-05-16.

## 2016-07-05 NOTE — Progress Notes (Signed)
Remote pacemaker transmission.   

## 2016-07-08 ENCOUNTER — Telehealth: Payer: Self-pay

## 2016-07-08 LAB — CUP PACEART REMOTE DEVICE CHECK
Battery Remaining Longevity: 22 mo
Battery Voltage: 2.84 V
Brady Statistic AS VP Percent: 92 %
Brady Statistic AS VS Percent: 1.8 %
Brady Statistic RA Percent Paced: 5.2 %
Date Time Interrogation Session: 20180322210505
Implantable Lead Implant Date: 20100726
Implantable Lead Location: 753859
Implantable Lead Location: 753860
Implantable Pulse Generator Implant Date: 20100726
Lead Channel Impedance Value: 340 Ohm
Lead Channel Impedance Value: 560 Ohm
Lead Channel Pacing Threshold Amplitude: 1 V
Lead Channel Pacing Threshold Pulse Width: 0.4 ms
Lead Channel Sensing Intrinsic Amplitude: 11.3 mV
Lead Channel Setting Pacing Amplitude: 2.5 V
Lead Channel Setting Sensing Sensitivity: 6 mV
MDC IDC LEAD IMPLANT DT: 20100726
MDC IDC MSMT BATTERY REMAINING PERCENTAGE: 20 %
MDC IDC MSMT LEADCHNL RA SENSING INTR AMPL: 3.3 mV
MDC IDC MSMT LEADCHNL RV PACING THRESHOLD AMPLITUDE: 0.75 V
MDC IDC MSMT LEADCHNL RV PACING THRESHOLD PULSEWIDTH: 0.4 ms
MDC IDC SET LEADCHNL RV PACING AMPLITUDE: 2.5 V
MDC IDC SET LEADCHNL RV PACING PULSEWIDTH: 0.4 ms
MDC IDC STAT BRADY AP VP PERCENT: 5.5 %
MDC IDC STAT BRADY AP VS PERCENT: 1 %
MDC IDC STAT BRADY RV PERCENT PACED: 98 %
Pulse Gen Model: 2210
Pulse Gen Serial Number: 2328745

## 2016-07-08 NOTE — Telephone Encounter (Signed)
Spoke with Ms. Austin Pittman, informed her that pts implant date needed to be corrected in pts device from where the device hardware reset, and information was not corrected at apt w/ Dr. Rayann Heman on 3/19. Pts daughter agreeable to apt in [place] on 07/26/2016 at 1:30pm

## 2016-07-26 ENCOUNTER — Ambulatory Visit (INDEPENDENT_AMBULATORY_CARE_PROVIDER_SITE_OTHER): Payer: Self-pay | Admitting: *Deleted

## 2016-07-26 DIAGNOSIS — Z950 Presence of cardiac pacemaker: Secondary | ICD-10-CM

## 2016-07-26 DIAGNOSIS — I442 Atrioventricular block, complete: Secondary | ICD-10-CM

## 2016-07-26 LAB — CUP PACEART INCLINIC DEVICE CHECK
Date Time Interrogation Session: 20180413141438
Implantable Lead Implant Date: 20100726
Implantable Lead Location: 753859
Implantable Lead Location: 753860
Implantable Lead Model: 1948
MDC IDC LEAD IMPLANT DT: 20100726
MDC IDC PG IMPLANT DT: 20100726
Pulse Gen Serial Number: 2328745

## 2016-07-26 NOTE — Progress Notes (Signed)
Programming change in office. DOI corrected to 11/07/2008. F/u via Merlin as scheduled.

## 2016-08-21 ENCOUNTER — Ambulatory Visit (INDEPENDENT_AMBULATORY_CARE_PROVIDER_SITE_OTHER): Payer: PPO | Admitting: *Deleted

## 2016-08-21 DIAGNOSIS — I442 Atrioventricular block, complete: Secondary | ICD-10-CM

## 2016-08-21 NOTE — Progress Notes (Signed)
Remote pacemaker transmission.   

## 2016-08-23 ENCOUNTER — Encounter: Payer: Self-pay | Admitting: Cardiology

## 2016-08-23 LAB — CUP PACEART REMOTE DEVICE CHECK
Brady Statistic AP VP Percent: 19 %
Brady Statistic AS VP Percent: 74 %
Brady Statistic RA Percent Paced: 18 %
Brady Statistic RV Percent Paced: 94 %
Date Time Interrogation Session: 20180509071304
Implantable Lead Implant Date: 20100726
Implantable Lead Location: 753859
Implantable Lead Location: 753860
Implantable Lead Model: 1948
Lead Channel Impedance Value: 350 Ohm
Lead Channel Impedance Value: 560 Ohm
Lead Channel Pacing Threshold Pulse Width: 0.4 ms
Lead Channel Sensing Intrinsic Amplitude: 12 mV
Lead Channel Setting Pacing Amplitude: 2.5 V
MDC IDC LEAD IMPLANT DT: 20100726
MDC IDC MSMT BATTERY REMAINING LONGEVITY: 37 mo
MDC IDC MSMT BATTERY REMAINING PERCENTAGE: 35 %
MDC IDC MSMT BATTERY VOLTAGE: 2.83 V
MDC IDC MSMT LEADCHNL RA PACING THRESHOLD AMPLITUDE: 1 V
MDC IDC MSMT LEADCHNL RA SENSING INTR AMPL: 3.1 mV
MDC IDC MSMT LEADCHNL RV PACING THRESHOLD AMPLITUDE: 0.75 V
MDC IDC MSMT LEADCHNL RV PACING THRESHOLD PULSEWIDTH: 0.4 ms
MDC IDC PG IMPLANT DT: 20100726
MDC IDC SET LEADCHNL RV PACING AMPLITUDE: 2.5 V
MDC IDC SET LEADCHNL RV PACING PULSEWIDTH: 0.4 ms
MDC IDC SET LEADCHNL RV SENSING SENSITIVITY: 6 mV
MDC IDC STAT BRADY AP VS PERCENT: 1 %
MDC IDC STAT BRADY AS VS PERCENT: 4.6 %
Pulse Gen Model: 2210
Pulse Gen Serial Number: 2328745

## 2016-09-02 ENCOUNTER — Ambulatory Visit (INDEPENDENT_AMBULATORY_CARE_PROVIDER_SITE_OTHER): Payer: PPO | Admitting: Family Medicine

## 2016-09-02 ENCOUNTER — Encounter: Payer: Self-pay | Admitting: Family Medicine

## 2016-09-02 VITALS — BP 135/63 | HR 86 | Temp 97.2°F | Ht 68.0 in | Wt 171.0 lb

## 2016-09-02 DIAGNOSIS — Z950 Presence of cardiac pacemaker: Secondary | ICD-10-CM | POA: Diagnosis not present

## 2016-09-02 DIAGNOSIS — I480 Paroxysmal atrial fibrillation: Secondary | ICD-10-CM

## 2016-09-02 DIAGNOSIS — R600 Localized edema: Secondary | ICD-10-CM | POA: Diagnosis not present

## 2016-09-02 MED ORDER — CELECOXIB 200 MG PO CAPS: 200.0000 mg | ORAL_CAPSULE | Freq: Every day | ORAL | 1 refills | Status: DC

## 2016-09-02 NOTE — Progress Notes (Signed)
Subjective:  Patient ID: Austin Pittman, male    DOB: 1916-04-15  Age: 83 y.o. MRN: [id number]  CC: Hearing Problem (pt here today with daughter c/o loss of hearing in his left ear)   HPI Austin Pittman presents for Several days to weeks of decreased hearing in the left ear. Also lots of aches and pains knees back etc. Daughter says he started walking with his knees bent and his spine slightly flexed. Additionally he complains of knee pain.Awoke In the night several weeks ago with palpitations. Has a pacemaker. Has A fib but no rate control med.  History Austin Pittman has a past medical history of Acute confusion due to known medical condition (09/12/2011); Atrial tachycardia ([place]); Diarrhea; Glaucoma; Hiatal hernia; Macular degeneration (09/12/2011); Pacemaker; RBBB (right bundle branch block with left anterior fascicular block); and Second degree Mobitz II AV block.   He has a past surgical history that includes Appendectomy; Pacemaker insertion (11/07/08); laparoscopy (09/22/2011); and Colon surgery (June, 21).   His family history includes Colon cancer in his daughter; Heart disease in his father; Hypertension in his mother; Kidney disease in his brother.He reports that he quit smoking about 70 years ago. His smoking use included Cigarettes. He started smoking about 89 years ago. He has a 20.00 pack-year smoking history. He quit smokeless tobacco use about 68 years ago. His smokeless tobacco use included Chew. He reports that he does not drink alcohol or use drugs.  Current Outpatient Prescriptions on File Prior to Visit  Medication Sig Dispense Refill  . acetaminophen (TYLENOL) 500 MG tablet Take 500 mg by mouth every 6 (six) hours as needed. Pain     . cholestyramine (QUESTRAN) 4 GM/DOSE powder Take by mouth daily. As directed    . latanoprost (XALATAN) 0.005 % ophthalmic solution Place 1 drop into both eyes at bedtime.     . Triamcinolone Acetonide (TRIAMCINOLONE 0.1 % CREAM : EUCERIN) CREA Apply 1  application topically 2 (two) times daily as needed. 1 each 1   No current facility-administered medications on file prior to visit.     ROS Review of Systems  Objective:  BP 135/63   Pulse 86   Temp 97.2 F (36.2 C) (Oral)   Ht 5\' 8"  (1.727 m)   Wt 171 lb (77.6 kg)   BMI 26.00 kg/m   Physical Exam  Constitutional: He appears well-developed and well-nourished.  HENT:  Head: Normocephalic and atraumatic.  Right Ear: Tympanic membrane and external ear normal. No decreased hearing is noted.  Left Ear: Tympanic membrane and external ear normal. No decreased hearing is noted.  Mouth/Throat: No oropharyngeal exudate or posterior oropharyngeal erythema.  Eyes: Pupils are equal, round, and reactive to light.  Neck: Normal range of motion. Neck supple.  Cardiovascular: Normal rate and regular rhythm.   No murmur heard. Pulmonary/Chest: Breath sounds normal. No respiratory distress.  Abdominal: Soft. Bowel sounds are normal. He exhibits no mass. There is no tenderness.  Musculoskeletal: He exhibits edema (2+ bilateral pretibial).  Vitals reviewed.   Assessment & Plan:   Austin Pittman was seen today for hearing problem.  Diagnoses and all orders for this visit:  Paroxysmal atrial fibrillation (HCC)  Localized edema  Pacemaker-St.Jude  Other orders -     celecoxib (CELEBREX) 200 MG capsule; Take 1 capsule (200 mg total) by mouth daily. With food, for joint pain (esp. Knees & back)   I am having Austin Pittman start on celecoxib. I am also having him maintain his acetaminophen, cholestyramine, latanoprost,  and triamcinolone 0.1 % cream : eucerin.  Meds ordered this encounter  Medications  . celecoxib (CELEBREX) 200 MG capsule    Sig: Take 1 capsule (200 mg total) by mouth daily. With food, for joint pain (esp. Knees & back)    Dispense:  90 capsule    Refill:  1     Follow-up: Return if symptoms worsen or fail to improve.  Claretta Fraise, M.D.

## 2016-10-07 ENCOUNTER — Encounter: Payer: Self-pay | Admitting: Cardiovascular Disease

## 2016-10-07 ENCOUNTER — Ambulatory Visit (INDEPENDENT_AMBULATORY_CARE_PROVIDER_SITE_OTHER): Payer: PPO | Admitting: Cardiovascular Disease

## 2016-10-07 VITALS — BP 104/62 | HR 82 | Ht 68.0 in | Wt 169.0 lb

## 2016-10-07 DIAGNOSIS — R600 Localized edema: Secondary | ICD-10-CM

## 2016-10-07 DIAGNOSIS — I480 Paroxysmal atrial fibrillation: Secondary | ICD-10-CM | POA: Diagnosis not present

## 2016-10-07 DIAGNOSIS — I5022 Chronic systolic (congestive) heart failure: Secondary | ICD-10-CM | POA: Diagnosis not present

## 2016-10-07 DIAGNOSIS — Z950 Presence of cardiac pacemaker: Secondary | ICD-10-CM

## 2016-10-07 NOTE — Progress Notes (Signed)
SUBJECTIVE: The patient presents for routine follow-up. His pacemaker was last evaluated and reprogrammed by Dr. Rayann Heman on 07/01/16. He has very rare paroxysmal atrial fibrillation.  He also has a history of presumed CAD following an abnormal stress test in 2005, for which he declined cardiac catheterization.  In 10/2011, echocardiogram showed EF 40-45% with mild LVH and diastolic dysfunction; normal RV systolic function, and mild AR/MR  This compares to prior study in 2010, revealing normal LVF (EF 55-65%), with grade 1 diastolic dysfunction, and mild AR.   He has leg swelling alleviated with Lasix 20 mg daily and takes supplemental potassium. This was restarted 2 weeks ago at the [place]. He denies chest pain.  He is here with his daughter, Olegario Shearer, who is also my patient.   Soc Hx: He continues to live alone [place] farm near [employer], where he was born, with his daughters Olegario Shearer (lives across the street) and [name] (lives in [place]) checking on him daily. He served in [place] during [place].  Review of Systems: As per "subjective", otherwise negative.  No Known Allergies  Current Outpatient Prescriptions  Medication Sig Dispense Refill  . acetaminophen (TYLENOL) 500 MG tablet Take 500 mg by mouth every 6 (six) hours as needed. Pain     . cholestyramine (QUESTRAN) 4 GM/DOSE powder Take by mouth daily. As directed    . furosemide (LASIX) 20 MG tablet Take 20 mg by mouth daily.    Marland Kitchen latanoprost (XALATAN) 0.005 % ophthalmic solution Place 1 drop into both eyes at bedtime.     . Potassium Bicarb & Chloride (POTASSIUM BICARBORNATE & POTASSIUM CHLORIDE) 25 MEQ disintegrating tablet Take 25 mEq by mouth 2 (two) times daily.    . Triamcinolone Acetonide (TRIAMCINOLONE 0.1 % CREAM : EUCERIN) CREA Apply 1 application topically 2 (two) times daily as needed. 1 each 1   No current facility-administered medications for this visit.     Past Medical History:  Diagnosis Date  . Acute confusion due  to known medical condition 09/12/2011  . Atrial tachycardia (HCC)    nonsustained  . Diarrhea   . Glaucoma   . Hiatal hernia   . Macular degeneration 09/12/2011  . Pacemaker   . 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 Second degree Mobitz II AV block    s/p PPM     Past Surgical History:  Procedure Laterality Date  . APPENDECTOMY    . COLON SURGERY  June, 21   [facility]  . LAPAROSCOPY  09/22/2011   Procedure: LAPAROSCOPY DIAGNOSTIC;  Surgeon: Edward Jolly, MD;  Location: [facility];  Service: General;  Laterality: N/A;  resectiion terminal ileum cecum with anastamosis  . PACEMAKER INSERTION  11/07/08   St Jude implanted by Dr Rayann Heman    Social History   Social History  . Marital status: Widowed    Spouse name: N/A  . Number of children: 2  . Years of education: N/A   Occupational History  . Not on file.   Social History Main Topics  . Smoking status: Former Smoker    Packs/day: 1.00    Years: 20.00    Types: Cigarettes    Start date: 04/20/1927    Quit date: 04/15/1946  . Smokeless tobacco: Former Systems developer    Types: [place] date: 04/15/1948     Comment: chewed tobacco for 2 years  . Alcohol use No  . Drug use: No  . Sexual activity:  No   Other Topics Concern  . Not on file   Social History Narrative   Lives alone at home   Wife passed in 1993-Had a CAD   [name]     Vitals:   10/07/16 1404  BP: 104/62  Pulse: 82  SpO2: 97%  Weight: 169 lb (76.7 kg)  Height: 5\' 8"  (1.727 m)    Wt Readings from Last 3 Encounters:  10/07/16 169 lb (76.7 kg)  09/02/16 171 lb (77.6 kg)  04/26/16 174 lb 3.2 oz (79 kg)     PHYSICAL EXAM General: NAD HEENT: Normal. Neck: No JVD, no thyromegaly. Lungs: Clear to auscultation bilaterally with normal respiratory effort. CV: Nondisplaced PMI.  Regular rate and rhythm, normal S1/S2, no S3/S4, no murmur. Trace bilateral pretibial and dorsal pedal edema.   Abdomen: Soft, nontender, no  distention.  Neurologic: Alert and oriented.  Psych: Normal affect. Skin: Normal. Musculoskeletal: No gross deformities.    ECG: Most recent ECG reviewed.   Labs:    Lipids: No results found for: LDLCALC, LDLDIRECT, CHOL, TRIG, HDL     ASSESSMENT AND PLAN: 1. Chronic systolic heart failure with ischemic cardiomyopathy: Symptomatically stable on Lasix 20 mg daily.  2. Symptomatic bradycardia/intermittent complete heart block status post pacemaker: Symptom medically stable. Normal device function as detailed above in March 2018. Follows with Dr. Rayann Heman.  3. Very rare paroxysmal fibrillation: Anticoagulation is not advisable given age, frailty, and history of falls.    Disposition: Follow up with me in 1 yr. FU with Dr. Rayann Heman in 02/2017.  Kate Sable, M.D., F.A.C.C.

## 2016-10-07 NOTE — Patient Instructions (Signed)

## 2016-11-20 ENCOUNTER — Ambulatory Visit (INDEPENDENT_AMBULATORY_CARE_PROVIDER_SITE_OTHER): Payer: PPO | Admitting: *Deleted

## 2016-11-20 DIAGNOSIS — I442 Atrioventricular block, complete: Secondary | ICD-10-CM | POA: Diagnosis not present

## 2016-11-20 NOTE — Progress Notes (Signed)
Remote pacemaker transmission.   

## 2016-11-21 ENCOUNTER — Encounter: Payer: Self-pay | Admitting: Cardiology

## 2016-11-26 LAB — CUP PACEART REMOTE DEVICE CHECK
Battery Remaining Longevity: 31 mo
Battery Remaining Percentage: 29 %
Battery Voltage: 2.81 V
Brady Statistic AP VS Percent: 1 %
Brady Statistic AS VS Percent: 3.3 %
Date Time Interrogation Session: 20180808074134
Implantable Lead Implant Date: 20100726
Implantable Lead Location: 753859
Implantable Lead Location: 753860
Implantable Lead Model: 1948
Implantable Pulse Generator Implant Date: 20100726
Lead Channel Impedance Value: 340 Ohm
Lead Channel Pacing Threshold Amplitude: 0.75 V
Lead Channel Pacing Threshold Amplitude: 1 V
Lead Channel Pacing Threshold Pulse Width: 0.4 ms
Lead Channel Sensing Intrinsic Amplitude: 2.9 mV
Lead Channel Setting Pacing Amplitude: 2.5 V
Lead Channel Setting Pacing Amplitude: 2.5 V
MDC IDC LEAD IMPLANT DT: 20100726
MDC IDC MSMT LEADCHNL RA PACING THRESHOLD PULSEWIDTH: 0.4 ms
MDC IDC MSMT LEADCHNL RV IMPEDANCE VALUE: 560 Ohm
MDC IDC MSMT LEADCHNL RV SENSING INTR AMPL: 12 mV
MDC IDC PG SERIAL: 2328745
MDC IDC SET LEADCHNL RV PACING PULSEWIDTH: 0.4 ms
MDC IDC SET LEADCHNL RV SENSING SENSITIVITY: 6 mV
MDC IDC STAT BRADY AP VP PERCENT: 15 %
MDC IDC STAT BRADY AS VP PERCENT: 80 %
MDC IDC STAT BRADY RA PERCENT PACED: 14 %
MDC IDC STAT BRADY RV PERCENT PACED: 96 %

## 2017-01-24 ENCOUNTER — Ambulatory Visit: Payer: PPO | Admitting: Family Medicine

## 2017-02-04 DIAGNOSIS — I70203 Unspecified atherosclerosis of native arteries of extremities, bilateral legs: Secondary | ICD-10-CM | POA: Diagnosis not present

## 2017-02-04 DIAGNOSIS — L84 Corns and callosities: Secondary | ICD-10-CM | POA: Diagnosis not present

## 2017-02-04 DIAGNOSIS — B351 Tinea unguium: Secondary | ICD-10-CM | POA: Diagnosis not present

## 2017-02-14 ENCOUNTER — Encounter: Payer: Medicare HMO | Admitting: Internal Medicine

## 2017-02-19 ENCOUNTER — Ambulatory Visit (INDEPENDENT_AMBULATORY_CARE_PROVIDER_SITE_OTHER): Payer: PPO | Admitting: *Deleted

## 2017-02-19 DIAGNOSIS — I442 Atrioventricular block, complete: Secondary | ICD-10-CM | POA: Diagnosis not present

## 2017-02-19 NOTE — Progress Notes (Signed)
Remote pacemaker transmission.   

## 2017-02-20 ENCOUNTER — Encounter: Payer: Self-pay | Admitting: Cardiology

## 2017-02-21 ENCOUNTER — Encounter: Payer: Self-pay | Admitting: Internal Medicine

## 2017-02-21 ENCOUNTER — Ambulatory Visit: Payer: PPO | Admitting: Internal Medicine

## 2017-02-21 ENCOUNTER — Other Ambulatory Visit: Payer: Self-pay

## 2017-02-21 VITALS — BP 122/68 | HR 77 | Ht 68.0 in | Wt 169.0 lb

## 2017-02-21 DIAGNOSIS — I442 Atrioventricular block, complete: Secondary | ICD-10-CM | POA: Diagnosis not present

## 2017-02-21 DIAGNOSIS — I428 Other cardiomyopathies: Secondary | ICD-10-CM | POA: Diagnosis not present

## 2017-02-21 DIAGNOSIS — I480 Paroxysmal atrial fibrillation: Secondary | ICD-10-CM | POA: Diagnosis not present

## 2017-02-21 NOTE — Patient Instructions (Signed)
Medication Instructions:  Continue all current medications.  Labwork: none  Testing/Procedures: none  Follow-Up: Your physician wants you to follow up in:  1 year.  You will receive a reminder letter in the mail one-two months in advance.  If you don't receive a letter, please call our office to schedule the follow up appointment   Any Other Special Instructions Will Be Listed Below (If Applicable). Remote monitoring is used to monitor your Pacemaker of ICD from home. This monitoring reduces the number of office visits required to check your device to one time per year. It allows [place] to keep an eye on the functioning of your device to ensure it is working properly. You are scheduled for a device check from home on 05-21-2017. You may send your transmission at any time that day. If you have a wireless device, the transmission will be sent automatically. After your physician reviews your transmission, you will receive a postcard with your next transmission date.  If you need a refill on your cardiac medications before your next appointment, please call your pharmacy.

## 2017-02-21 NOTE — Progress Notes (Signed)
PCP: Timmothy Euler, MD Primary Cardiologist:  Dr Bronson Ing Primary EP:  Dr Tawnya Crook is a 83 y.o. male who presents today for routine electrophysiology followup.  Since last being seen in our clinic, the patient reports doing very well.  Unsteady and walks with a walker.  + hoarseness and SOB chronically.   Today, he denies symptoms of palpitations, chest pain,  lower extremity edema, dizziness, presyncope, or syncope.  The patient is otherwise without complaint today.  Continues to ride his Conservation officer, nature.  Accompanied by daughter Olegario Shearer) today.  Past Medical History:  Diagnosis Date  . Acute confusion due to known medical condition 09/12/2011  . Atrial tachycardia (HCC)    nonsustained  . Diarrhea   . Glaucoma   . Hiatal hernia   . Macular degeneration 09/12/2011  . Pacemaker   . 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 Second degree Mobitz II AV block    s/p PPM    Past Surgical History:  Procedure Laterality Date  . APPENDECTOMY    . COLON SURGERY  June, 21   [facility]  . PACEMAKER INSERTION  11/07/08   St Jude implanted by Dr Rayann Heman    ROS- all systems are reviewed and negative except as per HPI above  Current Outpatient Medications  Medication Sig Dispense Refill  . acetaminophen (TYLENOL) 500 MG tablet Take 500 mg by mouth every 6 (six) hours as needed. Pain     . cholestyramine (QUESTRAN) 4 GM/DOSE powder Take by mouth daily. As directed    . furosemide (LASIX) 20 MG tablet Take 20 mg by mouth daily.    Marland Kitchen latanoprost (XALATAN) 0.005 % ophthalmic solution Place 1 drop into both eyes at bedtime.     . Potassium Bicarb & Chloride (POTASSIUM BICARBORNATE & POTASSIUM CHLORIDE) 25 MEQ disintegrating tablet Take 25 mEq by mouth 2 (two) times daily.    . Triamcinolone Acetonide (TRIAMCINOLONE 0.1 % CREAM : EUCERIN) CREA Apply 1 application topically 2 (two) times daily as needed. 1 each 1   No current facility-administered medications  for this visit.     Physical Exam: Vitals:   02/21/17 0919  BP: 122/68  Pulse: 77  SpO2: (!) 85%  Weight: 76.7 kg (169 lb)  Height: 5\' 8"  (1.727 m)    GEN- The patient is elderly appearing, alert and oriented x 3 today.  Walks with a walker Head- normocephalic, atraumatic Eyes-  Sclera clear, conjunctiva pink Ears- hearing intact Oropharynx- clear Lungs- Clear to ausculation bilaterally, normal work of breathing Chest- pacemaker pocket is well healed Heart- Regular rate and rhythm, no murmurs, rubs or gallops, PMI not laterally displaced GI- soft, NT, ND, + BS Extremities- no clubbing, cyanosis, or edema  Pacemaker interrogation- reviewed in detail today,  See PACEART report  ekg tracing ordered today is personally reviewed and shows sinus rhythm with V pacing  Assessment and Plan:  1. Symptomatic complete heart block Normal pacemaker function See Pace Art report No changes today He continues to have rare noise from his RV lead (noticed for at least 4 years).  Impedance and threshold are stable.  I will turn autocapture on today.  Given advanced age and comorbidities, would like to avoid lead revision at this time.  We should strongly consider new lead at time of generator change however.  Sensitivity decreased from 6 to 8 mV today  2. nonischemc CM Stable No change required today  3. afib No  sustained afib in the past year, episodes previously < 2 minutes Given advanced age and falls, he is not a candidate for anticoagulation presently Could reconsider if AF burden increases  Merlin Return to see me in a year  Thompson Grayer MD, [facility] 02/21/2017 9:59 AM

## 2017-02-21 NOTE — Addendum Note (Signed)
Addended by: Julian Hy T on: 02/21/2017 02:32 PM   Modules accepted: Orders

## 2017-03-09 LAB — CUP PACEART REMOTE DEVICE CHECK
Battery Remaining Longevity: 26 mo
Battery Remaining Percentage: 25 %
Battery Voltage: 2.8 V
Brady Statistic AP VS Percent: 1 %
Date Time Interrogation Session: 20181107081003
Implantable Lead Implant Date: 20100726
Implantable Lead Implant Date: 20100726
Implantable Lead Location: 753860
Implantable Pulse Generator Implant Date: 20100726
Lead Channel Impedance Value: 340 Ohm
Lead Channel Pacing Threshold Amplitude: 0.75 V
Lead Channel Pacing Threshold Amplitude: 1 V
Lead Channel Pacing Threshold Pulse Width: 0.4 ms
Lead Channel Sensing Intrinsic Amplitude: 3.1 mV
Lead Channel Setting Pacing Pulse Width: 0.4 ms
Lead Channel Setting Sensing Sensitivity: 6 mV
MDC IDC LEAD LOCATION: 753859
MDC IDC MSMT LEADCHNL RV IMPEDANCE VALUE: 540 Ohm
MDC IDC MSMT LEADCHNL RV PACING THRESHOLD PULSEWIDTH: 0.4 ms
MDC IDC MSMT LEADCHNL RV SENSING INTR AMPL: 11.3 mV
MDC IDC SET LEADCHNL RA PACING AMPLITUDE: 2.5 V
MDC IDC SET LEADCHNL RV PACING AMPLITUDE: 2.5 V
MDC IDC STAT BRADY AP VP PERCENT: 14 %
MDC IDC STAT BRADY AS VP PERCENT: 83 %
MDC IDC STAT BRADY AS VS PERCENT: 2.1 %
MDC IDC STAT BRADY RA PERCENT PACED: 14 %
MDC IDC STAT BRADY RV PERCENT PACED: 97 %
Pulse Gen Model: 2210
Pulse Gen Serial Number: 2328745

## 2017-03-12 ENCOUNTER — Encounter: Payer: Self-pay | Admitting: Family Medicine

## 2017-03-12 ENCOUNTER — Ambulatory Visit: Payer: PPO | Admitting: Family Medicine

## 2017-03-12 VITALS — BP 108/61 | HR 75 | Temp 98.1°F | Ht 68.0 in | Wt 167.0 lb

## 2017-03-12 DIAGNOSIS — Z23 Encounter for immunization: Secondary | ICD-10-CM

## 2017-03-12 DIAGNOSIS — R195 Other fecal abnormalities: Secondary | ICD-10-CM | POA: Diagnosis not present

## 2017-03-12 DIAGNOSIS — M2560 Stiffness of unspecified joint, not elsewhere classified: Secondary | ICD-10-CM

## 2017-03-12 MED ORDER — PREDNISONE 20 MG PO TABS: 20.0000 mg | ORAL_TABLET | Freq: Every day | ORAL | 0 refills | Status: DC

## 2017-03-12 NOTE — Patient Instructions (Signed)
Great to see you!  I recommend using depends on a daily basis.  I have started you on prednisone to help with your shoulder and muscle pains.  I have started treating you for Polymyalgia rheumatica, the best indication of this is very good response to prednisone.

## 2017-03-12 NOTE — Progress Notes (Addendum)
   HPI  Patient presents today here with bilateral shoulder pain.  Patient notes bilateral shoulder pain and stiffness for several weeks, his daughters are present and state that he has been complaining of bilateral leg pain for quite some time, however he denies this today.  We also discussed loose stools. He has 5-6 loose stools daily, this is been going on for years after surgery to correct and large intestine blockage. They have tried cholestyramine without improvement. He has also tried Imodium which caused constipation and hospitalization.  Family filled out PHQ- they deny any concern for SI  PMH: Smoking status noted ROS: Per HPI  Objective: BP 108/61   Pulse 75   Temp 98.1 F (36.7 C) (Oral)   Ht '5\' 8"'$  (1.727 m)   Wt 167 lb (75.8 kg)   SpO2 100%   BMI 25.39 kg/m  Gen: NAD, alert, cooperative with exam HEENT: NCAT CV: RRR, good S1/S2, no murmur Resp: CTABL, no wheezes, non-labored Ext: No edema, warm Neuro: Alert and oriented, No gross deficits  MSK  Range of motion with flexion limited bilaterally with no flexion beyond 90 degrees of the upper arms bilaterally Tenderness on the right with Hawkins sign, tenderness in the left with Neer sign, otherwise negative provocative rotator cuff signs Strength preserved bilaterally  Assessment and plan:  #Joint stiffness Patient with bilateral shoulder stiffness and pain, also with some complaints of leg pain in the thighs for several weeks explained by his family, he denies this today. Most likely diagnosis in my opinion is polymyalgia rheumatica He did not try Celebrex Trial of reasonably low dose prednisone  #Loose stools After intestinal surgery, this has been long-standing Recommended trial of Metamucil to see if bulk forming laxative will slow down his motility at least divide some regulation Recommended wearing depends while at home as well.  He does not make it to the toilet 2 or 3 times a week and causes quite  a mess for his daughters to clean up.     Orders Placed This Encounter  Procedures  . Sedimentation Rate  . CMP14+EGFR    Meds ordered this encounter  Medications  . predniSONE (DELTASONE) 20 MG tablet    Sig: Take 1 tablet (20 mg total) by mouth daily with breakfast.    Dispense:  7 tablet    Refill:  0    [name], MD [facility] 03/12/2017, 12:05 PM

## 2017-03-13 LAB — CMP14+EGFR
A/G RATIO: 1.5 (ref 1.2–2.2)
ALK PHOS: 66 IU/L (ref 39–117)
ALT: 13 IU/L (ref 0–44)
AST: 20 IU/L (ref 0–40)
Albumin: 4.3 g/dL (ref 3.2–4.6)
BILIRUBIN TOTAL: 0.3 mg/dL (ref 0.0–1.2)
BUN/Creatinine Ratio: 27 — ABNORMAL HIGH (ref 10–24)
BUN: 35 mg/dL (ref 10–36)
CHLORIDE: 104 mmol/L (ref 96–106)
CO2: 24 mmol/L (ref 20–29)
Calcium: 9.2 mg/dL (ref 8.6–10.2)
Creatinine, Ser: 1.3 mg/dL — ABNORMAL HIGH (ref 0.76–1.27)
GFR calc Af Amer: 52 mL/min/{1.73_m2} — ABNORMAL LOW (ref >59)
GFR calc non Af Amer: 45 mL/min/{1.73_m2} — ABNORMAL LOW (ref >59)
Globulin, Total: 2.9 g/dL (ref 1.5–4.5)
Glucose: 96 mg/dL (ref 65–99)
POTASSIUM: 4.4 mmol/L (ref 3.5–5.2)
Sodium: 144 mmol/L (ref 134–144)
Total Protein: 7.2 g/dL (ref 6.0–8.5)

## 2017-03-13 LAB — SEDIMENTATION RATE: SED RATE: 7 mm/h (ref 0–30)

## 2017-03-18 ENCOUNTER — Telehealth: Payer: Self-pay | Admitting: Family Medicine

## 2017-03-18 LAB — CUP PACEART INCLINIC DEVICE CHECK
Implantable Lead Implant Date: 20100726
Implantable Lead Location: 753859
Implantable Lead Model: 1948
Implantable Pulse Generator Implant Date: 20100726
MDC IDC LEAD IMPLANT DT: 20100726
MDC IDC LEAD LOCATION: 753860
MDC IDC PG SERIAL: 2328745
MDC IDC SESS DTM: 20181204155410
Pulse Gen Model: 2210

## 2017-03-18 MED ORDER — PREDNISONE 10 MG PO TABS: 10.0000 mg | ORAL_TABLET | Freq: Every day | ORAL | 2 refills | Status: DC

## 2017-03-18 NOTE — Telephone Encounter (Signed)
Treating pt with prednisone for clinically Dx PMR.   Decrease to 10 mg of prednisone which is usually still effective. Finish 20 mg pills at 1 pill per day then decrease to 10 mg.   Follow up in 2 months to be sure that he is doing well, will likely treat for 6 months to a year with a very slow decline in prednisone after that.   [name], MD [facility] 03/18/2017, 5:00 PM

## 2017-03-18 NOTE — Telephone Encounter (Signed)
Jocelyn Lamer- daughter states was told to call letting us know if the prednisone is working. States that it is working and they would like a refill sent to the pharmacy. Please advise.

## 2017-03-19 NOTE — Telephone Encounter (Signed)
Aware and verbalizes understanding.  

## 2017-03-31 ENCOUNTER — Telehealth: Payer: Self-pay | Admitting: Family Medicine

## 2017-03-31 NOTE — Telephone Encounter (Signed)
Please advise 

## 2017-03-31 NOTE — Telephone Encounter (Signed)
Called and discussed.   He was confused and held a gun when his daughter entered the house. They have removed all bullist from the house and gun.   He is adamant that that he was not pointing at his daughter.   He was much more feeble since starting prednisone, however it worked very well for his pain.  I recommended cutting down Prednisone 5mg , for 4 days then stop.  If activity improves then we can consider this a prednisone side effect.  Another variant is that his daughter, who I have discussed this with tonight, was coming several days a week, taking him out of the house and out to eat.  She is now undergoing radiation treatment and is unable to visit her dad.  She would like some help cleaning him up at home, I am unsure if this is personal care services or home health, will discuss with staff and then either have face-to-face for home health for recommend [place] visit to pursue personal care services.  [name], MD [facility] 03/31/2017, 5:44 PM

## 2017-05-21 ENCOUNTER — Ambulatory Visit (INDEPENDENT_AMBULATORY_CARE_PROVIDER_SITE_OTHER): Payer: PPO | Admitting: *Deleted

## 2017-05-21 DIAGNOSIS — I442 Atrioventricular block, complete: Secondary | ICD-10-CM | POA: Diagnosis not present

## 2017-05-21 NOTE — Progress Notes (Signed)
Remote pacemaker transmission.   

## 2017-05-22 ENCOUNTER — Encounter: Payer: Self-pay | Admitting: Cardiology

## 2017-06-10 ENCOUNTER — Ambulatory Visit (INDEPENDENT_AMBULATORY_CARE_PROVIDER_SITE_OTHER): Payer: PPO | Admitting: Family Medicine

## 2017-06-10 ENCOUNTER — Encounter: Payer: Self-pay | Admitting: Family Medicine

## 2017-06-10 VITALS — BP 101/59 | HR 86 | Temp 96.7°F

## 2017-06-10 DIAGNOSIS — M353 Polymyalgia rheumatica: Secondary | ICD-10-CM

## 2017-06-10 MED ORDER — PREDNISONE 10 MG PO TABS
10.0000 mg | ORAL_TABLET | Freq: Every day | ORAL | 2 refills | Status: DC
Start: 2017-06-10 — End: 2017-08-22

## 2017-06-10 NOTE — Progress Notes (Signed)
   HPI  Patient presents today with muscle and joint pains.  Patient was previously started on prednisone for presumed polymyalgia rheumatica.  He had dramatic improvement on 20 mg of prednisone, he was also improved with 10 mg of prednisone.  He did have some behavioral issues which were difficult to say if they were due to the medication or to social changes with his daughter having surgery.  He was changed over to meloxicam by the [place] PCP. His daughters accompany him today and states that he has not had improved pain with this.  PMH: Smoking status noted ROS: Per HPI  Objective: BP (!) 101/59   Pulse 86   Temp (!) 96.7 F (35.9 C) (Oral)  Gen: NAD, alert, cooperative with exam HEENT: NCAT CV: RRR, good S1/S2, no murmur Resp: CTABL, no wheezes, non-labored Ext: No edema, warm Neuro: Alert and oriented, No gross deficits  Assessment and plan:  #Polymyalgia rheumatica Clinical diagnosis Restart prednisone, 20 mg x 5 days, then 10 mg x 1 month, then attempt 5 mg. Discuss side effects and problems associated with long-term steroid use,   Meds ordered this encounter  Medications  . predniSONE (DELTASONE) 10 MG tablet    Sig: Take 1 tablet (10 mg total) by mouth daily with breakfast.    Dispense:  30 tablet    Refill:  2    [name], MD [facility] 06/10/2017, 2:42 PM

## 2017-06-23 LAB — CUP PACEART REMOTE DEVICE CHECK
Date Time Interrogation Session: 20190311125230
Implantable Lead Implant Date: 20100726
Implantable Lead Implant Date: 20100726
Implantable Lead Location: 753859
Implantable Lead Model: 1948
Implantable Pulse Generator Implant Date: 20100726
MDC IDC LEAD LOCATION: 753860
MDC IDC PG SERIAL: 2328745
Pulse Gen Model: 2210

## 2017-08-20 ENCOUNTER — Ambulatory Visit (INDEPENDENT_AMBULATORY_CARE_PROVIDER_SITE_OTHER): Payer: PPO | Admitting: *Deleted

## 2017-08-20 DIAGNOSIS — I442 Atrioventricular block, complete: Secondary | ICD-10-CM | POA: Diagnosis not present

## 2017-08-20 NOTE — Progress Notes (Signed)
Remote pacemaker transmission.   

## 2017-08-21 ENCOUNTER — Encounter: Payer: Self-pay | Admitting: Cardiology

## 2017-08-22 ENCOUNTER — Ambulatory Visit (INDEPENDENT_AMBULATORY_CARE_PROVIDER_SITE_OTHER): Payer: PPO | Admitting: Family Medicine

## 2017-08-22 ENCOUNTER — Encounter: Payer: Self-pay | Admitting: Family Medicine

## 2017-08-22 VITALS — BP 105/64 | HR 88 | Temp 97.1°F | Ht 68.0 in | Wt 179.2 lb

## 2017-08-22 DIAGNOSIS — B353 Tinea pedis: Secondary | ICD-10-CM

## 2017-08-22 DIAGNOSIS — R32 Unspecified urinary incontinence: Secondary | ICD-10-CM

## 2017-08-22 DIAGNOSIS — R413 Other amnesia: Secondary | ICD-10-CM

## 2017-08-22 DIAGNOSIS — R739 Hyperglycemia, unspecified: Secondary | ICD-10-CM | POA: Diagnosis not present

## 2017-08-22 DIAGNOSIS — R410 Disorientation, unspecified: Secondary | ICD-10-CM | POA: Diagnosis not present

## 2017-08-22 DIAGNOSIS — M353 Polymyalgia rheumatica: Secondary | ICD-10-CM | POA: Diagnosis not present

## 2017-08-22 DIAGNOSIS — R159 Full incontinence of feces: Secondary | ICD-10-CM

## 2017-08-22 MED ORDER — KETOCONAZOLE 2 % EX CREA: 1.0000 "application " | TOPICAL_CREAM | Freq: Every day | CUTANEOUS | 1 refills | Status: AC

## 2017-08-22 MED ORDER — TRIAMCINOLONE ACETONIDE 0.1 % EX CREA: 1.0000 "application " | TOPICAL_CREAM | Freq: Two times a day (BID) | CUTANEOUS | 0 refills | Status: AC

## 2017-08-22 MED ORDER — PREDNISONE 2 MG PO TBEC: DELAYED_RELEASE_TABLET | ORAL | 0 refills | Status: DC

## 2017-08-22 NOTE — Progress Notes (Signed)
   HPI  Patient presents today for follow-up chronic medical conditions.  Polymyalgia rheumatica He had very good improvement with prednisone, he has breakthrough pain whenever he misses a pill for 1 day.  His family and he are interested in home health.  He is recently developed persistent urinary incontinence and intermittent fecal incontinence. They state that he is having to change clothes 2 times already today.  He is unable to walk without a walker.  He is unable to stand without assistance.  He cannot walk more than 30 feet with a walker.  He has a shuffling gait per his family's description.  He is able to feed himself, however he is legally blind and has a difficult time finding with food. He is unable to button his shirt or put his own clothes on. He is not able to prepare his own meals.  His daughter who is present has power of attorney and he does not manage his money.  He is forgetful, he does have memory loss, at times he is confused.  His daughter has recently purchased a new RV, when he saw in the yard he stated "I am not going to feed and water that thing".  PMH: Smoking status noted ROS: Per HPI  Objective: BP 105/64   Pulse 88   Temp (!) 97.1 F (36.2 C) (Oral)   Ht _0  (1.727 m)   Wt 179 lb 3.2 oz (81.3 kg)   BMI 27.25 kg/m  Gen: NAD, alert, wheelchair bound HEENT: NCAT, EOMI, PERRL CV: RRR, good S1/S2, no murmur Resp: CTABL, no wheezes, non-labored Abd: SNTND, BS present, no guarding or organomegaly Ext: 1+ pitting edema bilateral lower extremities Neuro: Alert, Strength 3-4/5 symmetric bilaterally Skin:  Multiple hyperkeratotic skin lesions on the bilateral arms Bilateral feet with multiple areas of erythema with surrounding erythematous border, on the left he has a circular lesion measuring 3 cm, on the right is 8.4 cm x 10, however after looking at the other foot there are 4 other areas there.  Assessment and plan:  # memory loss, confusion  intermittently Patient likely has underlying dementia. No acute changes, however he has had a steady decline and now has developed urinary incontinence and intermittent fecal incontinence. Home health paperwork filled out, he would easily qualify for nursing home level care.  The family is taking very good care of him   #Polymyalgia rheumatica Very well managed by prednisone, however he is gaining weight. BMP today Begin titrating medication, 4 mg x 1 month then titrate down by 1 mg/month  #Tinea pedis Treat with ketoconazole, also given 0.1% triamcinolone cream to help with irritation.    Orders Placed This Encounter  Procedures  . BMP8+EGFR    Meds ordered this encounter  Medications  . predniSONE 2 MG TBEC    Sig: 4 mg for 1 month, then 3 mg for month, then 2 mg for 1 month, then 1 mg for month then stop    Dispense:  150 tablet    Refill:  0  . ketoconazole (NIZORAL) 2 % cream    Sig: Apply 1 application topically daily.    Dispense:  30 g    Refill:  1  . triamcinolone cream (KENALOG) 0.1 %    Sig: Apply 1 application topically 2 (two) times daily.    Dispense:  30 g    Refill:  [place], MD [facility] 08/22/2017, 2:22 PM

## 2017-08-23 LAB — BMP8+EGFR
BUN/Creatinine Ratio: 26 — ABNORMAL HIGH (ref 10–24)
BUN: 33 mg/dL (ref 10–36)
CHLORIDE: 101 mmol/L (ref 96–106)
CO2: 24 mmol/L (ref 20–29)
Calcium: 8.8 mg/dL (ref 8.6–10.2)
Creatinine, Ser: 1.25 mg/dL (ref 0.76–1.27)
GFR, EST AFRICAN AMERICAN: 54 mL/min/{1.73_m2} — AB (ref >59)
GFR, EST NON AFRICAN AMERICAN: 47 mL/min/{1.73_m2} — AB (ref >59)
Glucose: 165 mg/dL — ABNORMAL HIGH (ref 65–99)
Potassium: 4.2 mmol/L (ref 3.5–5.2)
SODIUM: 142 mmol/L (ref 134–144)

## 2017-08-26 ENCOUNTER — Telehealth: Payer: Self-pay

## 2017-08-27 DIAGNOSIS — R739 Hyperglycemia, unspecified: Secondary | ICD-10-CM | POA: Diagnosis not present

## 2017-08-27 LAB — BAYER DCA HB A1C WAIVED: HB A1C (BAYER DCA - WAIVED): 5.4 % (ref <7.0)

## 2017-08-27 NOTE — Addendum Note (Signed)
Addended by: Rolena Infante on: 08/27/2017 03:50 PM   Modules accepted: Orders

## 2017-08-28 NOTE — Addendum Note (Signed)
Addended by: Nigel Berthold C on: 08/28/2017 08:08 AM   Modules accepted: Orders

## 2017-08-28 NOTE — Telephone Encounter (Signed)
x

## 2017-09-04 ENCOUNTER — Telehealth: Payer: Self-pay

## 2017-09-04 MED ORDER — PREDNISONE 1 MG PO TABS: ORAL_TABLET | ORAL | 0 refills | Status: DC

## 2017-09-04 NOTE — Telephone Encounter (Signed)
notified

## 2017-09-04 NOTE — Telephone Encounter (Signed)
I am getting a prior auth for Rayos 2 mg   You wrote for Prednisone  NOt sure why it's getting run this way   Should I do prior auth?

## 2017-09-04 NOTE — Telephone Encounter (Signed)
PA requested fo 2 mg prednisone, will switch to 1 mg prednisone and still slowly taper 4 mg X 30 days, 3mg  X 30 days etc.   [name], MD [facility] 09/04/2017, 11:12 AM

## 2017-09-09 LAB — CUP PACEART REMOTE DEVICE CHECK
Battery Remaining Percentage: 10 %
Battery Voltage: 2.71 V
Brady Statistic AP VP Percent: 16 %
Brady Statistic AS VS Percent: 1 %
Brady Statistic RA Percent Paced: 15 %
Brady Statistic RV Percent Paced: 99 %
Date Time Interrogation Session: 20190508071003
Implantable Lead Implant Date: 20100726
Implantable Lead Implant Date: 20100726
Implantable Lead Location: 753860
Implantable Pulse Generator Implant Date: 20100726
Lead Channel Impedance Value: 460 Ohm
Lead Channel Pacing Threshold Amplitude: 1 V
Lead Channel Pacing Threshold Pulse Width: 0.4 ms
Lead Channel Sensing Intrinsic Amplitude: 12 mV
Lead Channel Sensing Intrinsic Amplitude: 3.4 mV
Lead Channel Setting Pacing Amplitude: 0.75 V
Lead Channel Setting Sensing Sensitivity: 8 mV
MDC IDC LEAD LOCATION: 753859
MDC IDC MSMT BATTERY REMAINING LONGEVITY: 11 mo
MDC IDC MSMT LEADCHNL RA IMPEDANCE VALUE: 340 Ohm
MDC IDC MSMT LEADCHNL RV PACING THRESHOLD AMPLITUDE: 0.5 V
MDC IDC MSMT LEADCHNL RV PACING THRESHOLD PULSEWIDTH: 0.4 ms
MDC IDC SET LEADCHNL RA PACING AMPLITUDE: 2.5 V
MDC IDC SET LEADCHNL RV PACING PULSEWIDTH: 0.4 ms
MDC IDC STAT BRADY AP VS PERCENT: 1 %
MDC IDC STAT BRADY AS VP PERCENT: 83 %
Pulse Gen Model: 2210
Pulse Gen Serial Number: 2328745

## 2017-10-30 ENCOUNTER — Ambulatory Visit: Payer: PPO | Admitting: Cardiovascular Disease

## 2017-10-30 ENCOUNTER — Encounter: Payer: Self-pay | Admitting: Cardiovascular Disease

## 2017-10-30 VITALS — BP 90/62 | HR 93 | Ht 68.0 in | Wt 176.0 lb

## 2017-10-30 DIAGNOSIS — I5023 Acute on chronic systolic (congestive) heart failure: Secondary | ICD-10-CM

## 2017-10-30 DIAGNOSIS — I480 Paroxysmal atrial fibrillation: Secondary | ICD-10-CM

## 2017-10-30 DIAGNOSIS — Z950 Presence of cardiac pacemaker: Secondary | ICD-10-CM | POA: Diagnosis not present

## 2017-10-30 NOTE — Progress Notes (Signed)
SUBJECTIVE: The patient presents for routine follow-up.  He has chronic exertional dyspnea which is stable.  He has a pacemaker and is followed by Dr. Rayann Heman, most recently in November 2018.  I reviewed device interrogation.  He has some RV lead noise but impedance and threshold were stable.  There is mention of consideration for a new lead at the time of generator change.  He has atrial fibrillation with no sustained episodes and given his advanced age and falls, he is not a candidate for anticoagulation.  He also has a history of presumed CAD following an abnormal stress test in 2005, for which he declined cardiac catheterization.  In 10/2011, echocardiogram showed EF 40-45% with mild LVH and diastolic dysfunction; normal RV systolic function, and mild AR/MR  This compares to prior study in 2010, revealing normal LVF (EF 55-65%), with grade 1 diastolic dysfunction, and mild AR.   He is here with his daughter, Austin Pittman, who is also my patient as well as his daughter Austin Pittman.  His weight is up 9 pounds since 03/12/2017.  He has lower extremity edema.  He is unable to walk any significant distance without experiencing shortness of breath.  He denies chest pain.  Jocelyn Lamer lives next door and Austin Pittman has put an RV on the farm so that she can help take care of her father as well.  He refuses to be hospitalized.  He takes Lasix 20 mg daily and they have not given him extra Lasix as he is incontinent of both stool and urine.  He does not want to go to a nursing home.  He used to hunt and fish as a younger man and is unable to do the things that he used to do.  Food does not taste good to him anymore.     Soc Hx: He continues to live alone [place] near [place], [place] was [place], with his daughters Austin Pittman (lives across the street) and Austin Pittman (lives in [place]) checking on him daily. He served in [place] during [place].   Review of Systems: As per "subjective", otherwise negative.  No Known  Allergies  Current Outpatient Medications  Medication Sig Dispense Refill  . acetaminophen (TYLENOL) 500 MG tablet Take 500 mg by mouth every 6 (six) hours as needed. Pain     . cholestyramine (QUESTRAN) 4 GM/DOSE powder Take by mouth daily. As directed    . furosemide (LASIX) 20 MG tablet Take 20 mg by mouth daily.    Marland Kitchen ketoconazole (NIZORAL) 2 % cream Apply 1 application topically daily. 30 g 1  . latanoprost (XALATAN) 0.005 % ophthalmic solution Place 1 drop into both eyes at bedtime.     . Potassium Bicarb & Chloride (POTASSIUM BICARBORNATE & POTASSIUM CHLORIDE) 25 MEQ disintegrating tablet Take 25 mEq by mouth 2 (two) times daily.    . Triamcinolone Acetonide (TRIAMCINOLONE 0.1 % CREAM : EUCERIN) CREA Apply 1 application topically 2 (two) times daily as needed. 1 each 1  . triamcinolone cream (KENALOG) 0.1 % Apply 1 application topically 2 (two) times daily. 30 g 0   No current facility-administered medications for this visit.     Past Medical History:  Diagnosis Date  . Acute confusion due to known medical condition 09/12/2011  . Atrial tachycardia (HCC)    nonsustained  . Diarrhea   . Glaucoma   . Hiatal hernia   . Macular degeneration 09/12/2011  . Pacemaker   . RBBB (right bundle branch block with left anterior  fascicular block)   . Second degree Mobitz II AV block    s/p PPM     Past Surgical History:  Procedure Laterality Date  . APPENDECTOMY    . COLON SURGERY  June, 21   [facility]  . LAPAROSCOPY  09/22/2011   Procedure: LAPAROSCOPY DIAGNOSTIC;  Surgeon: Edward Jolly, MD;  Location: [facility];  Service: General;  Laterality: N/A;  resectiion terminal ileum cecum with anastamosis  . PACEMAKER INSERTION  11/07/08   St Jude implanted by Dr Rayann Heman    Social History   Socioeconomic History  . Marital status: Widowed    Spouse name: Not on file  . Number of children: 2  . Years of education: Not on file  . Highest education level: Not on file  Occupational History  .  Not on file  Social Needs  . Financial resource strain: Not on file  . Food insecurity:    Worry: Not on file    Inability: Not on file  . Transportation needs:    Medical: Not on file    Non-medical: Not on file  Tobacco Use  . Smoking status: Former Smoker    Packs/day: 1.00    Years: 20.00    Pack years: 20.00    Types: Cigarettes    Start date: 04/20/1927    Last attempt to quit: 04/15/1946    Years since quitting: 71.5  . Smokeless tobacco: Former Systems developer    Types: Chew    Quit date: 04/15/1948  . Tobacco comment: chewed tobacco for 2 years  Substance and Sexual Activity  . Alcohol use: No    Alcohol/week: 0.0 oz  . Drug use: No  . Sexual activity: Never  Lifestyle  . Physical activity:    Days per week: Not on file    Minutes per session: Not on file  . Stress: Not on file  Relationships  . Social connections:    Talks on phone: Not on file    Gets together: Not on file    Attends religious service: Not on file    Active member of club or organization: Not on file    Attends meetings of clubs or organizations: Not on file    Relationship status: Not on file  . Intimate partner violence:    Fear of current or ex partner: Not on file    Emotionally abused: Not on file    Physically abused: Not on file    Forced sexual activity: Not on file  Other Topics Concern  . Not on file  Social History Narrative   Lives alone at home   Wife passed in 1993-Had a CAD   [name]-[phone]     Vitals:   10/30/17 0949  BP: 90/62  Pittman: 93  SpO2: 96%  Weight: 176 lb (79.8 kg)  Height: 5\' 8"  (1.727 m)    Wt Readings from Last 3 Encounters:  10/30/17 176 lb (79.8 kg)  08/22/17 179 lb 3.2 oz (81.3 kg)  03/12/17 167 lb (75.8 kg)     PHYSICAL EXAM General: NAD, elderly, frail. HEENT: Normal. Neck: No JVD, no thyromegaly. Lungs: Clear to auscultation bilaterally with normal respiratory effort. CV: Regular rate and rhythm, normal S1/S2, no S3/S4, no murmur.  1+ pitting  bilateral lower extremity and dorsal pedal edema.  Abdomen: Soft, nontender, no distention.  Neurologic: Alert and oriented.  Psych: Normal affect. Skin: Normal. Musculoskeletal: No gross deformities.    ECG: Reviewed above under Subjective   Labs:  Lipids: No results found for: LDLCALC, LDLDIRECT, CHOL, TRIG, HDL     ASSESSMENT AND PLAN:  1.  Acute on chronic systolic heart failure with ischemic cardiomyopathy: Because of his wishes, it makes management very difficult.  I had a long talk with both of his daughters about hospitalizing him for at least 1 or 2 days so that he can receive IV Lasix under a monitored setting but the patient refuses to go to the hospital.  They are unable to give him extra Lasix at home because of bowel and bladder incontinence.  He needs palliative care.  I will make a referral regarding this.  I will also see if we can obtain home health in which case he might be able to have a Foley catheter and also have labs drawn at home if I would increase the Lasix dosage.  2. Symptomatic bradycardia/intermittent complete heart block status post pacemaker: Symptom medically stable. Normal device function as detailed above in March 2018. Follows with Dr. Rayann Heman.  3. Very rare paroxysmal fibrillation: Anticoagulation is not advisable given age, frailty, and history of falls.      Disposition: Follow up with me to be determined.  Time spent: 40 minutes, of which greater than 50% was spent reviewing symptoms, relevant blood tests and studies, and discussing management plan with the patient.    Kate Sable, M.D., F.A.C.C.

## 2017-10-30 NOTE — Patient Instructions (Addendum)
Medication Instructions:  Continue all current medications.  Labwork: none  Testing/Procedures: none  Follow-Up: To be determined   Any Other Special Instructions Will Be Listed Below (If Applicable). You have been referred to:  [facility]   If you need a refill on your cardiac medications before your next appointment, please call your pharmacy.

## 2017-11-10 ENCOUNTER — Ambulatory Visit (INDEPENDENT_AMBULATORY_CARE_PROVIDER_SITE_OTHER): Payer: Medicare Other

## 2017-11-10 DIAGNOSIS — Z950 Presence of cardiac pacemaker: Secondary | ICD-10-CM | POA: Diagnosis not present

## 2017-11-10 DIAGNOSIS — I429 Cardiomyopathy, unspecified: Secondary | ICD-10-CM | POA: Diagnosis not present

## 2017-11-10 DIAGNOSIS — I509 Heart failure, unspecified: Secondary | ICD-10-CM

## 2017-11-10 DIAGNOSIS — I480 Paroxysmal atrial fibrillation: Secondary | ICD-10-CM

## 2017-11-10 DIAGNOSIS — I519 Heart disease, unspecified: Secondary | ICD-10-CM

## 2017-11-10 DIAGNOSIS — H409 Unspecified glaucoma: Secondary | ICD-10-CM | POA: Diagnosis not present

## 2017-11-10 DIAGNOSIS — R531 Weakness: Secondary | ICD-10-CM

## 2017-11-10 DIAGNOSIS — R0602 Shortness of breath: Secondary | ICD-10-CM | POA: Diagnosis not present

## 2017-11-10 DIAGNOSIS — R52 Pain, unspecified: Secondary | ICD-10-CM | POA: Diagnosis not present

## 2017-11-10 DIAGNOSIS — H3530 Unspecified macular degeneration: Secondary | ICD-10-CM | POA: Diagnosis not present

## 2017-11-17 ENCOUNTER — Other Ambulatory Visit: Payer: Self-pay | Admitting: *Deleted

## 2017-11-17 DIAGNOSIS — I442 Atrioventricular block, complete: Secondary | ICD-10-CM

## 2017-11-17 DIAGNOSIS — I429 Cardiomyopathy, unspecified: Secondary | ICD-10-CM

## 2017-11-19 ENCOUNTER — Encounter: Payer: PPO | Admitting: *Deleted

## 2017-11-20 ENCOUNTER — Telehealth: Payer: Self-pay

## 2017-11-20 NOTE — Telephone Encounter (Signed)
Confirmed remote transmission w/ pt daughter.  Pt daughter states that the pt is in hospice and not expected to live more than two days. His daughter said thank you for keeping a close eye on him.

## 2017-11-25 ENCOUNTER — Encounter: Payer: Self-pay | Admitting: Cardiology

## 2017-12-14 DEATH — deceased

## 2018-02-13 ENCOUNTER — Encounter: Payer: PPO | Admitting: Internal Medicine
